# Patient Record
Sex: Male | Born: 1965 | Race: Black or African American | Hispanic: No
Health system: Midwestern US, Community
[De-identification: ages and names within clinical notes are randomized; demographics above are authoritative.]

## PROBLEM LIST (undated history)

## (undated) DIAGNOSIS — N342 Other urethritis: Secondary | ICD-10-CM

## (undated) DIAGNOSIS — M5412 Radiculopathy, cervical region: Secondary | ICD-10-CM

## (undated) DIAGNOSIS — F419 Anxiety disorder, unspecified: Secondary | ICD-10-CM

## (undated) DIAGNOSIS — G459 Transient cerebral ischemic attack, unspecified: Secondary | ICD-10-CM

## (undated) DIAGNOSIS — I639 Cerebral infarction, unspecified: Secondary | ICD-10-CM

## (undated) DIAGNOSIS — I2699 Other pulmonary embolism without acute cor pulmonale: Secondary | ICD-10-CM

## (undated) DIAGNOSIS — Z87442 Personal history of urinary calculi: Secondary | ICD-10-CM

## (undated) HISTORY — PX: NECK SURGERY: SHX720

---

## 2014-04-24 ENCOUNTER — Ambulatory Visit
Admit: 2014-04-24 | Discharge: 2014-04-24 | Payer: PRIVATE HEALTH INSURANCE | Attending: Family Medicine | Primary: Family Medicine

## 2014-04-24 DIAGNOSIS — F319 Bipolar disorder, unspecified: Secondary | ICD-10-CM

## 2014-04-24 NOTE — Progress Notes (Signed)
SUBJECTIVE:  Mitchell Yoder is a 49 y.o. year old male   Chief Complaint   Patient presents with   ??? Establish Care       History of Present Illness:   He is here to establish care.  He comes from out of town.  He has history of mild biploar disorder and is taking medications for it; he does not know the name of the medication.  He has chronic neck/ low back pain and has had treatment for may years from neurosurgery and physiatry in the past.  He was taking pain medications.  He has been out for months and does not know what he was taking.  He needs referral to pain management and psychiatry.     Past Medical History   Diagnosis Date   ??? Gout    ??? Depression    ??? Arthritis    ??? S/P cervical spinal fusion 02/2002     c6-c7   ??? Bulging lumbar disc 1996     L1 -S5   ??? Bipolar affective (HCC) 2013     Past Surgical History   Procedure Laterality Date   ??? Hx cervical fusion          No current outpatient prescriptions on file.     No current facility-administered medications for this visit.       Allergies   Allergen Reactions   ??? Morphine Not Reported This Time        Family History   Problem Relation Age of Onset   ??? Hypertension Mother    ??? Colon Polyps Mother    ??? Neuropathy Mother    ??? Alzheimer Father    ??? Bipolar Disorder Sister    ??? Neuropathy Brother    ??? Cancer Maternal Aunt [id number]     colon cancer   ??? Diabetes Paternal Aunt    ??? Bipolar Disorder Other    ??? Neuropathy Other    ??? Other Son      Mental problems   ??? Other Son      Mental problems   ??? Other Daughter      Mental problems   ??? Diabetes Paternal Aunt         History   Substance Use Topics   ??? Smoking status: Current Some Day Smoker -- 0.25 packs/day for 25 years   ??? Smokeless tobacco: Never Used   ??? Alcohol Use: 3.6 oz/week     6 Cans of beer per week       Review of Systems:   ROS: Constitutional: No fever, chills, night sweats, malaise, dizziness.    Eyes: Denies any acute complaints.     Ear/Nose/Throat: No ear/ throat/ sinus pain, lesions, unusual discharge, new speaking or hearing problems, epistaxis.  Cardiovascular: No angina, palpitations, PND, orthopnea, lightheadedness, edema, claudication.    Respiratory: No dyspnea, wheeze, pleurisy, hemoptysis, unusual cough or sputum.    Gastrointestinal: No nausea/ vomiting, bowel habit change, pain, GER symptoms, melena, hematochezia, anorexia.    Genitourinary: Denies any complaints.    Musculoskeletal: As above.    Skin/Breast: Denies any complaints.    Neurological: No seizures, numbness, dizziness, speech abnormality, incontinence.    Psychiatric: No agitation, confusion/disorientation, suicidal or homicidal ideation.    Endocrine: Denies any complaints.    Heme/Onc/Lympha: Denies any complaints.    Allergy/Immunol: Denies any complaints.    OBJECTIVE:  Physical Exam:   Constitutional: General Appearance:  well developed, well nourished, nontoxic, in no acute distress.  Visit Vitals   Item Reading   ??? BP 108/75 mmHg   ??? Pulse 92   ??? Temp(Src) 98.4 ??F (36.9 ??C) (Oral)   ??? Resp 15   ??? Ht  (1.727 m)   ??? Wt 160 lb (72.576 kg)   ??? BMI 24.33 kg/m2   ??? SpO2 97%     Eyes: Conjunctiva: normal & Lids: normal.  Pupils & Irises: normal size; equal, round and reactive to light.  ENT/Mouth: Otoscopic Examination: ear canals are clear; tympanic membranes are clear.  Nose: clear.  Throat:  clear.  Neck Area: Neck: without masses, symmetric, trachea is midline.  Thyroid:  without significant enlargement, masses or tenderness.  Pulmonary: Respiratory effort: normal; no dyspnea, no retractions, no accessory muscle use.   Auscultation: normal & symmetrical air exchange; no rales, no rhonchi, no wheeze; no rubs  Cardiovascular: Palpation: PMI not displaced or enlarged, no thrills or heaves.   Auscultation: RRR; no murmur, rubs or gallops.   Extremities: no edema, no active varicosity. Neck: carotid arteries- normal volume &  without bruit; no JVD. Femoral arteries: normal volume, no bruit. Pedal pulses: normal volume.  Gastrointestinal: Normal bowel sounds.  No masses; no tenderness; no rebound/rigidity; no CVA tenderness.  No hepatosplenomegaly.  Skin: no acute rash.  Nodes: Cervical: no significant adenopathy.  Inguinal: no significant adenopathy.  Psychiatric: Oriented to time, place and person.   Normal mood, no agitation or anxiety.  Normal affect.  Pleasant and cooperative.  Mild pressure of speech and flight of ideas.   Neurologic: CN I to XII: intact.  DTR: symmetrical & normal.   Musculoskeletal: NC/AT.  Neck-supple. Gait and Station: gait- normal; station- normal.  All Extremities: no heat, effusion, redness or tenderness; normal strength/tone; no instability.  Neck/ low back: no redness, heat or effusion.    Records requested.     ASSESSMENT:     1. Bipolar 1 disorder, depressed (HCC)    2. Chronic pain syndrome    3. Lumbar disc disease    4. S/P cervical spinal fusion        PLAN:       Orders Placed This Encounter   ??? REFERRAL TO PSYCHIATRY   ??? REFERRAL TO PAIN MANAGEMENT        Other Instructions: Discussed DDx, follow-up & work-up.  Discussed risk/benefit & side effect of treatment.  Follow up visit as planned, prn sooner.  Back and neck care.  Health risk from non adherence discussed.  Patient voiced understanding.     Follow-up Disposition:  Return in about 1 month (around 05/23/2014).    Joneen Boers, MD

## 2014-05-21 ENCOUNTER — Encounter

## 2014-05-21 ENCOUNTER — Ambulatory Visit
Admit: 2014-05-21 | Discharge: 2014-05-21 | Payer: PRIVATE HEALTH INSURANCE | Attending: Family Medicine | Primary: Family Medicine

## 2014-05-21 DIAGNOSIS — N342 Other urethritis: Secondary | ICD-10-CM

## 2014-05-21 LAB — AMB POC URINALYSIS DIP STICK AUTO W/O MICRO
Bilirubin (UA POC): NEGATIVE
Blood (UA POC): NEGATIVE
Glucose (UA POC): NEGATIVE
Ketones (UA POC): NEGATIVE
Leukocyte esterase (UA POC): NEGATIVE
Nitrites (UA POC): NEGATIVE
Protein (UA POC): NEGATIVE mg/dL
Specific gravity (UA POC): 1.03 (ref 1.001–1.035)
Urobilinogen (UA POC): 0.2 (ref 0.2–1)
pH (UA POC): 7.5 (ref 4.6–8.0)

## 2014-05-21 MED ORDER — AZITHROMYCIN 250 MG TAB
250 mg | ORAL_TABLET | ORAL | Status: AC
Start: 2014-05-21 — End: 2014-05-26

## 2014-05-21 MED ORDER — CEFTRIAXONE 250 MG SOLUTION FOR INJECTION
250 mg | Freq: Once | INTRAMUSCULAR | Status: AC
Start: 2014-05-21 — End: 2014-05-21

## 2014-05-21 NOTE — Patient Instructions (Signed)
Ceftriaxone (Injection)   Ceftriaxone (sef-trye-AX-one)  Treats infections. This medicine is a cephalosporin antibiotic.   Brand Name(s):Amerinet Choice cefTRIAXone, Novaplus Ceftriaxone, Novaplus cefTRIAXone, Rocephin   There may be other brand names for this medicine.  When This Medicine Should Not Be Used:   This medicine is not right for everyone. Do not use it if you had an allergic reaction to any other cephalosporin antibiotic.  How to Use This Medicine:   Injectable  ?? Your doctor will prescribe your exact dose and tell you how often it should be given. This medicine is given as a shot into a muscle or through a needle placed into a vein.  ?? A nurse or other health provider will give you this medicine.  ?? Missed dose: You must use this medicine on a fixed schedule. Call your doctor or pharmacist if you miss a dose.  Drugs and Foods to Avoid:     Ask your doctor or pharmacist before using any other medicine, including over-the-counter medicines, vitamins, and herbal products.     Warnings While Using This Medicine:   ?? Tell your doctor if you are pregnant or breastfeeding, or if you have kidney disease, liver disease, anemia, gallbladder disease, pancreas problems, or a history of stomach or bowel disease, such as colitis. Tell your doctor if you are allergic to penicillin.  ?? This medicine can cause diarrhea. Call your doctor if the diarrhea becomes severe, does not stop, or is bloody. Do not take any medicine to stop diarrhea until you have talked to your doctor. Diarrhea can occur 2 months or more after you stop taking this medicine.  ?? Your doctor will do lab tests at regular visits to check on the effects of this medicine. Keep all appointments.  ?? Take all of the medicine in your prescription to clear up your infection, even if you feel better after the first few doses.  ?? Call your doctor if your symptoms do not improve or if they get worse.  Possible Side Effects While Using This Medicine:    Call your doctor right away if you notice any of these side effects:  ?? Allergic reaction: Itching or hives, swelling in your face or hands, swelling or tingling in your mouth or throat, chest tightness, trouble breathing  ?? Dark urine or pale stools, nausea, vomiting, loss of appetite, stomach pain, yellow skin or eyes  ?? Severe diarrhea, diarrhea that contains blood, or vomiting  ?? Shortness of breath, tiredness, uneven heartbeat  ?? Unusual bleeding, bruising, or weakness  If you notice these less serious side effects, talk with your doctor:   ?? Change or loss of taste  ?? Dizziness or headache  ?? Mild rash or itching skin  ?? Pain, redness, or swelling where the shot is given  ?? Vaginal itching or discharge  If you notice other side effects that you think are caused by this medicine, tell your doctor.   Call your doctor for medical advice about side effects. You may report side effects to FDA at 1-800-FDA-1088  ?? 2014 [employer] Information is for End User's use only and may not be sold, redistributed or otherwise used for commercial purposes.  The above information is an educational aid only. It is not intended as medical advice for individual conditions or treatments. Talk to your doctor, nurse or pharmacist before following any medical regimen to see if it is safe and effective for you.

## 2014-05-21 NOTE — Progress Notes (Signed)
Michael LitterJonathan Holzmann is a 49 y.o. male presents today for same day sick visit for male issues. Pt is in Room # 6      Learning Assessment (baseline): Completed  Depression Screening: Completed      1. Have you been to the ER, urgent care clinic since your last visit? Hospitalized since your last visit?No    2. Have you seen or consulted any other health care providers outside of the [facility] since your last visit? Include any pap smears or colon screening. No      Michael LitterJonathan Slingerland is a 49 y.o. male who presents for Rocephin injection.    He declines to have Rocephin injection in gluteal area, however requests in right deltoid.  He denies any symptoms , reactions or allergies that would exclude them from receiving this injection today.  Risks and adverse reactions were discussed and the  patient education sheet was given to them. All questions were addressed.  He was observed for 5 min post injection, declines any further observation. There were no reactions observed.    Zachary GeorgeMichelle M Graycen Degan

## 2014-05-21 NOTE — Progress Notes (Signed)
SUBJECTIVE:  Mitchell Yoder is a 49 y.o. year old male   Chief Complaint   Patient presents with   ??? Penile Discharge       History of Present Illness:   He is having mild purulent penile discharge off and on with some dysuria for 10 days.  He has a new partner with intercourse a week earlier. No systemic symptoms.  No other new complaints.    Past Medical History   Diagnosis Date   ??? Gout    ??? Depression    ??? Arthritis    ??? S/P cervical spinal fusion 02/2002     c6-c7   ??? Bulging lumbar disc 1996     L1 -S5   ??? Bipolar affective (HCC) 2013   ??? Hx of pulmonary embolus      Past Surgical History   Procedure Laterality Date   ??? Hx cervical fusion          Current Outpatient Prescriptions   Medication Sig   ??? indomethacin (INDOCIN) 50 mg capsule Take 50 mg by mouth three (3) times daily. Indications: GOUT   ??? busPIRone (BUSPAR) 10 mg tablet Take 10 mg by mouth three (3) times daily.     No current facility-administered medications for this visit.       Allergies   Allergen Reactions   ??? Morphine Not Reported This Time        Family History   Problem Relation Age of Onset   ??? Hypertension Mother    ??? Colon Polyps Mother    ??? Neuropathy Mother    ??? Alzheimer Father    ??? Bipolar Disorder Sister    ??? Neuropathy Brother    ??? Cancer Maternal Aunt 85     colon cancer   ??? Diabetes Paternal Aunt    ??? Bipolar Disorder Other    ??? Neuropathy Other    ??? Other Son      Mental problems   ??? Other Son      Mental problems   ??? Other Daughter      Mental problems   ??? Diabetes Paternal Aunt         History   Substance Use Topics   ??? Smoking status: Current Some Day Smoker -- 0.25 packs/day for 25 years   ??? Smokeless tobacco: Never Used   ??? Alcohol Use: 3.6 oz/week     6 Cans of beer per week       Review of Systems:   ROS: Constitutional: No fever, chills, night sweats, malaise, dizziness.    Respiratory: No dyspnea, wheeze, pleurisy, hemoptysis, unusual cough or sputum.     Gastrointestinal: No nausea/ vomiting, bowel habit change, pain, GER symptoms, melena, hematochezia, anorexia.    Genitourinary: No hematuria, hesitancy, incontinence.  Skin/Breast: Denies any complaints.    Psychiatric: No agitation, confusion/disorientation, suicidal or homicidal ideation.      OBJECTIVE:  Physical Exam:   Constitutional: General Appearance:  well developed, well nourished, nontoxic, in no acute distress.   Visit Vitals   Item Reading   ??? BP 108/76 mmHg   ??? Pulse 76   ??? Temp(Src) 98.3 ??F (36.8 ??C) (Oral)   ??? Resp 15   ??? Ht  (1.727 m)   ??? Wt 158 lb (71.668 kg)   ??? BMI 24.03 kg/m2   ??? SpO2 97%     Pulmonary: Respiratory effort: normal; no dyspnea, no retractions, no accessory muscle use.     Skin: no  acute rash.  Nodes:  Inguinal: no significant adenopathy.  Psychiatric: Oriented to time, place and person.   Musculoskeletal: NC/AT.  Neck-supple.  GI:: Normal bowel sounds.  No masses; no tenderness; no rebound/rigidity; no CVA tenderness.  No hepatosplenomegaly.  GU:: Scrotal: no cord tenderness, testicular masses, hydrocele or spermatocele.   Penis: normal male, no masses, normal urethra, no discharge.  U/A- Neg.       ASSESSMENT:     1. Urethritis, sexually transmitted        PLAN:   Pharmacologic Management: Medications reviewed with the patient.  Zithromax 1 gm x1, Rocephin 250 mg x1.    Orders Placed This Encounter   ??? CHLAMYDIA/NEISSERIA BY NAA W/REFLEX CONFIRM   ??? AMB POC URINALYSIS DIP STICK AUTO W/O MICRO   ??? PR THER/PROPH/DIAG INJECTION, SUBCUT/IM   ??? cefTRIAXone (ROCEPHIN) injection   ??? azithromycin (ZITHROMAX) 250 mg tablet     Other Instructions: Discussed DDx, follow-up & work-up.  Discussed risk/benefit & side effect of treatment.  Follow up visit as planned (One week), prn sooner.  STD precautions.  Treat partner(s).  All discussed in details. Health risk from non adherence discussed.  Patient voiced understanding.     Joneen BoersSAGHANA Adellyn Capek, MD

## 2014-05-25 LAB — CHLAM/GC/TRICH NUCL AMP URINE
Chlamydia amplified,urine: NEGATIVE
GC Amplified urine: NEGATIVE
TRICH NUCU: NEGATIVE

## 2014-05-28 ENCOUNTER — Ambulatory Visit
Admit: 2014-05-28 | Discharge: 2014-05-28 | Payer: PRIVATE HEALTH INSURANCE | Attending: Family Medicine | Primary: Family Medicine

## 2014-05-28 ENCOUNTER — Encounter

## 2014-05-28 DIAGNOSIS — Z981 Arthrodesis status: Secondary | ICD-10-CM

## 2014-05-28 NOTE — Progress Notes (Signed)
Mitchell Yoder is a 49 y.o. male presents today for follow-up. Pt is in Room # 4    1. Have you been to the ER, urgent care clinic since your last visit?  Hospitalized since your last visit?No    2. Have you seen or consulted any other health care providers outside of the [facility] since your last visit?  Include any pap smears or colon screening. No
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
## 2014-06-01 LAB — CBC WITH AUTOMATED DIFF
ABS. BASOPHILS: 0 10*3/uL (ref 0.0–0.2)
ABS. EOSINOPHILS: 0.2 10*3/uL (ref 0.0–0.5)
ABS. MONOCYTES: 0.5 10*3/uL (ref 0.1–0.9)
ABS. NEUTROPHILS: 2.1 10*3/uL (ref 1.8–7.7)
ABSOLUTE LYMPHOCYTE COUNT: 2.3 10*3/uL (ref 1.0–4.8)
BASOPHILS: 0 % (ref 0–2)
EOSINOPHILS: 3 % (ref 0–6)
HCT: 48.5 % (ref 39.3–51.6)
HGB: 15.4 g/dL (ref 13.1–17.2)
Lymphocytes: 45 % (ref 27–45)
MCH: 33 pg (ref 26–34)
MCHC: 32 g/dL (ref 32–36)
MCV: 104 fL — ABNORMAL HIGH (ref 80–95)
MONOCYTES: 11 % — ABNORMAL HIGH (ref 3–9)
MPV: 10.1 fL (ref 6.0–10.8)
NEUTROPHILS: 41 % (ref 40–75)
PLATELET: 346 10*3/uL (ref 140–440)
RBC: 4.67 M/uL (ref 3.80–5.80)
RDW: 12.6 % (ref 10.0–16.0)
WBC: 5.1 10*3/uL (ref 4.0–11.0)

## 2014-06-01 LAB — METABOLIC PANEL, COMPREHENSIVE
A-G Ratio: 1.9 ratio (ref 1.1–2.6)
ALT (SGPT): 19 U/L (ref 5–40)
AST (SGOT): 21 U/L (ref 10–37)
Albumin: 4.4 g/dL (ref 3.5–5.0)
Alk. phosphatase: 71 U/L (ref 25–115)
Anion gap: 14 mmol/L
BUN: 9 mg/dL (ref 6–22)
Bilirubin, total: 0.4 mg/dL (ref 0.2–1.2)
CO2: 26 mmol/L (ref 20–32)
Calcium: 9.6 mg/dL (ref 8.4–10.4)
Chloride: 103 mmol/L (ref 98–110)
Creatinine: 0.9 mg/dL (ref 0.5–1.2)
GFRAA: >60 (ref >60.0)
GFRNA: >60 (ref >60.0)
Globulin: 2.3 g/dL (ref 2.0–4.0)
Glucose: 77 mg/dL (ref 65–99)
Potassium: 4.3 mmol/L (ref 3.5–5.5)
Protein, total: 6.7 g/dL (ref 6.4–8.3)
Sodium: 143 mmol/L (ref 133–145)

## 2014-06-01 LAB — VITAMIN B12: Vitamin B12: 366 pg/mL (ref 211–911)

## 2014-06-01 LAB — TSH 3RD GENERATION: TSH: 1.87 uU/mL (ref 0.27–4.20)

## 2014-06-01 LAB — VITAMIN D, 1, 25 DIHYDROXY: Calcitriol (Vit D 1, 25 di-OH): 52.2 pg/mL (ref 19.9–79)

## 2014-06-02 NOTE — Telephone Encounter (Signed)
Attempted to contact patient Mitchell Yoder regarding lab finding.  Call back number left and will call again.

## 2014-06-02 NOTE — Progress Notes (Signed)
Quick Note:        MCV was high. This is nonspecific. Otherwise BW was in range. Vitamin B12 was low normal. Advise taking OTC Vitamin B12 500 mcg daily.    ______

## 2014-07-09 ENCOUNTER — Encounter: Attending: Family Medicine | Primary: Family Medicine

## 2014-10-01 NOTE — Addendum Note (Signed)
Addended by: Marlane HatcherPierce, Phyllis M on: 10/01/2014 01:51 PM      Modules accepted: Level of Service

## 2017-11-29 ENCOUNTER — Emergency Department (HOSPITAL_COMMUNITY): Payer: Medicaid - Out of State

## 2017-11-29 ENCOUNTER — Other Ambulatory Visit: Payer: Self-pay

## 2017-11-29 ENCOUNTER — Inpatient Hospital Stay (HOSPITAL_COMMUNITY)
Admission: EM | Admit: 2017-11-29 | Discharge: 2017-12-03 | DRG: 176 | Disposition: A | Discharge status: Home / Self Care | Payer: Medicaid - Out of State | Attending: Internal Medicine | Admitting: Internal Medicine

## 2017-11-29 ENCOUNTER — Encounter (HOSPITAL_COMMUNITY): Payer: Self-pay | Admitting: Emergency Medicine

## 2017-11-29 DIAGNOSIS — Z8673 Personal history of transient ischemic attack (TIA), and cerebral infarction without residual deficits: Secondary | ICD-10-CM

## 2017-11-29 DIAGNOSIS — Z791 Long term (current) use of non-steroidal anti-inflammatories (NSAID): Secondary | ICD-10-CM

## 2017-11-29 DIAGNOSIS — F17210 Nicotine dependence, cigarettes, uncomplicated: Secondary | ICD-10-CM | POA: Diagnosis present

## 2017-11-29 DIAGNOSIS — I2699 Other pulmonary embolism without acute cor pulmonale: Principal | ICD-10-CM | POA: Diagnosis present

## 2017-11-29 DIAGNOSIS — Z23 Encounter for immunization: Secondary | ICD-10-CM

## 2017-11-29 DIAGNOSIS — R0902 Hypoxemia: Secondary | ICD-10-CM | POA: Diagnosis present

## 2017-11-29 DIAGNOSIS — F1410 Cocaine abuse, uncomplicated: Secondary | ICD-10-CM | POA: Diagnosis present

## 2017-11-29 DIAGNOSIS — Z86711 Personal history of pulmonary embolism: Secondary | ICD-10-CM

## 2017-11-29 DIAGNOSIS — Z79899 Other long term (current) drug therapy: Secondary | ICD-10-CM

## 2017-11-29 DIAGNOSIS — Z7982 Long term (current) use of aspirin: Secondary | ICD-10-CM

## 2017-11-29 HISTORY — DX: Other pulmonary embolism without acute cor pulmonale: I26.99

## 2017-11-29 HISTORY — DX: Personal history of urinary calculi: Z87.442

## 2017-11-29 HISTORY — DX: Cerebral infarction, unspecified: I63.9

## 2017-11-29 HISTORY — DX: Transient cerebral ischemic attack, unspecified: G45.9

## 2017-11-29 HISTORY — DX: Anxiety disorder, unspecified: F41.9

## 2017-11-29 LAB — CBC
HEMATOCRIT: 47.9 % (ref 39.0–52.0)
HEMOGLOBIN: 15.9 g/dL (ref 13.0–17.0)
MCH: 33.2 pg (ref 26.0–34.0)
MCHC: 33.2 g/dL (ref 30.0–36.0)
MCV: 100 fL (ref 78.0–100.0)
Platelets: 332 10*3/uL (ref 150–400)
RBC: 4.79 MIL/uL (ref 4.22–5.81)
RDW: 11.4 % — ABNORMAL LOW (ref 11.5–15.5)
WBC: 6.7 10*3/uL (ref 4.0–10.5)

## 2017-11-29 LAB — BASIC METABOLIC PANEL
ANION GAP: 11 (ref 5–15)
BUN: 15 mg/dL (ref 6–20)
CALCIUM: 9.2 mg/dL (ref 8.9–10.3)
CO2: 25 mmol/L (ref 22–32)
Chloride: 105 mmol/L (ref 98–111)
Creatinine, Ser: 1.18 mg/dL (ref 0.61–1.24)
GLUCOSE: 132 mg/dL — AB (ref 70–99)
Potassium: 3.8 mmol/L (ref 3.5–5.1)
SODIUM: 141 mmol/L (ref 135–145)

## 2017-11-29 LAB — I-STAT TROPONIN, ED: Troponin i, poc: 0 ng/mL (ref 0.00–0.08)

## 2017-11-29 MED ORDER — LORAZEPAM 1 MG PO TABS: 1.0000 mg | ORAL_TABLET | Freq: Four times a day (QID) | ORAL | Status: DC | PRN

## 2017-11-29 MED ORDER — ACETAMINOPHEN 650 MG RE SUPP: 650.0000 mg | Freq: Four times a day (QID) | RECTAL | Status: DC | PRN

## 2017-11-29 MED ORDER — HYDROMORPHONE HCL 1 MG/ML IJ SOLN
0.5000 mg | Freq: Once | INTRAMUSCULAR | Status: AC
  Administered 2017-11-29: 0.5 mg via INTRAVENOUS
  Filled 2017-11-29: qty 1

## 2017-11-29 MED ORDER — VITAMIN B-1 100 MG PO TABS
100.0000 mg | ORAL_TABLET | Freq: Every day | ORAL | Status: DC
  Administered 2017-11-29 – 2017-12-03 (×5): 100 mg via ORAL
  Filled 2017-11-29 (×6): qty 1

## 2017-11-29 MED ORDER — LORAZEPAM 2 MG/ML IJ SOLN: 1.0000 mg | Freq: Four times a day (QID) | INTRAMUSCULAR | Status: DC | PRN

## 2017-11-29 MED ORDER — PNEUMOCOCCAL VAC POLYVALENT 25 MCG/0.5ML IJ INJ
0.5000 mL | INJECTION | INTRAMUSCULAR | Status: AC
  Administered 2017-12-03: 0.5 mL via INTRAMUSCULAR
  Filled 2017-11-29 (×2): qty 0.5

## 2017-11-29 MED ORDER — ONDANSETRON HCL 4 MG PO TABS: 4.0000 mg | ORAL_TABLET | Freq: Four times a day (QID) | ORAL | Status: DC | PRN

## 2017-11-29 MED ORDER — ONDANSETRON HCL 4 MG/2ML IJ SOLN: 4.0000 mg | Freq: Four times a day (QID) | INTRAMUSCULAR | Status: DC | PRN

## 2017-11-29 MED ORDER — HEPARIN (PORCINE) IN NACL 100-0.45 UNIT/ML-% IJ SOLN
1350.0000 [IU]/h | INTRAMUSCULAR | Status: DC
  Administered 2017-11-29: 1200 [IU]/h via INTRAVENOUS
  Administered 2017-12-01: 1350 [IU]/h via INTRAVENOUS
  Filled 2017-11-29 (×3): qty 250

## 2017-11-29 MED ORDER — ACETAMINOPHEN 325 MG PO TABS
650.0000 mg | ORAL_TABLET | Freq: Four times a day (QID) | ORAL | Status: DC | PRN
  Administered 2017-12-01: 650 mg via ORAL
  Filled 2017-11-29: qty 2

## 2017-11-29 MED ORDER — IOPAMIDOL (ISOVUE-370) INJECTION 76%
100.0000 mL | Freq: Once | INTRAVENOUS | Status: AC | PRN
  Administered 2017-11-29: 100 mL via INTRAVENOUS

## 2017-11-29 MED ORDER — NICOTINE 7 MG/24HR TD PT24
7.0000 mg | MEDICATED_PATCH | Freq: Every day | TRANSDERMAL | Status: DC
  Administered 2017-11-29 – 2017-12-02 (×4): 7 mg via TRANSDERMAL
  Filled 2017-11-29 (×4): qty 1

## 2017-11-29 MED ORDER — IOPAMIDOL (ISOVUE-370) INJECTION 76%
INTRAVENOUS | Status: AC
  Filled 2017-11-29: qty 100

## 2017-11-29 MED ORDER — FOLIC ACID 1 MG PO TABS
1.0000 mg | ORAL_TABLET | Freq: Every day | ORAL | Status: DC
  Administered 2017-11-29 – 2017-12-03 (×5): 1 mg via ORAL
  Filled 2017-11-29 (×5): qty 1

## 2017-11-29 MED ORDER — ADULT MULTIVITAMIN W/MINERALS CH
1.0000 | ORAL_TABLET | Freq: Every day | ORAL | Status: DC
  Administered 2017-11-29 – 2017-12-03 (×5): 1 via ORAL
  Filled 2017-11-29 (×5): qty 1

## 2017-11-29 MED ORDER — TRAMADOL HCL 50 MG PO TABS
50.0000 mg | ORAL_TABLET | Freq: Four times a day (QID) | ORAL | Status: DC
  Administered 2017-11-29 – 2017-12-03 (×13): 50 mg via ORAL
  Filled 2017-11-29 (×14): qty 1

## 2017-11-29 MED ORDER — THIAMINE HCL 100 MG/ML IJ SOLN
100.0000 mg | Freq: Every day | INTRAMUSCULAR | Status: DC
  Administered 2017-11-30: 100 mg via INTRAVENOUS
  Filled 2017-11-29: qty 2

## 2017-11-29 MED ORDER — INFLUENZA VAC SPLIT QUAD 0.5 ML IM SUSY
0.5000 mL | PREFILLED_SYRINGE | INTRAMUSCULAR | Status: AC
  Administered 2017-12-01: 0.5 mL via INTRAMUSCULAR
  Filled 2017-11-29: qty 0.5

## 2017-11-29 MED ORDER — HEPARIN BOLUS VIA INFUSION
4200.0000 [IU] | Freq: Once | INTRAVENOUS | Status: AC
  Administered 2017-11-29: 4200 [IU] via INTRAVENOUS
  Filled 2017-11-29: qty 4200

## 2017-11-29 MED ORDER — POTASSIUM CHLORIDE IN NACL 20-0.45 MEQ/L-% IV SOLN
INTRAVENOUS | Status: DC
  Administered 2017-11-29 – 2017-11-30 (×2): via INTRAVENOUS
  Filled 2017-11-29 (×2): qty 1000

## 2017-11-29 NOTE — ED Provider Notes (Signed)
Patient placed in Quick Look pathway, seen and evaluated   Chief Complaint: chest pain  HPI:   Mike Castillo is a 52 y.o. male with hx of PE x3 who presents to the ED with chest pain and shortness of breath. Pt reports chest pain and SHOB starting today. 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 Pain increases with deep breath and movement.  ROS: Resp: shortness of breath  CV: chest pain  Physical Exam:  BP 126/81   Pulse (!) 126   Temp 99.1 F (37.3 C) (Oral)   Resp 20   SpO2 97%    Gen: No distress  Neuro: Awake and Alert  Skin: Warm and dry  Chest: tender with palpation left upper chest wall  Heart: tachycardia  Will try and place patient in room ASAP   Initiation of care has begun. The patient has been counseled on the process, plan, and necessity for staying for the completion/evaluation, and the remainder of the medical screening examination    Ashley Murrain, NP 11/29/17 [place]    Quintella Reichert, MD 11/30/17 [phone]

## 2017-11-29 NOTE — ED Notes (Signed)
Pt in XR, XR will bring pt to room.

## 2017-11-29 NOTE — ED Notes (Addendum)
Dr. Ralene Bathe stated EKG was abnormal, no STEMI.  Requested repeat EKG in 5-10 minutes.

## 2017-11-29 NOTE — ED Provider Notes (Signed)
[facility] EMERGENCY DEPARTMENT Provider Note   CSN: [id number] Arrival date & time: 11/29/17  1552     History   Chief Complaint Chief Complaint  Patient presents with  . Chest Pain    HPI Mike Castillo is a 52 y.o. male with past medical history of prior PE x3, TIA, presents to ED for evaluation of acute onset 6-hour history of left-sided chest pain and shortness of breath.  States that symptoms feel similar to his prior PE.  Last PE was diagnosed in 2011.  He is not currently anticoagulated.  States that pain is worse with deep inspiration and palpation.  Reports intermittent cough but denies any hemoptysis.  Reports nausea but no vomiting.  Pain will radiate to his left shoulder and left side of his back.  Denies any injuries or falls.  He does admit to cocaine and marijuana use with last cocaine use being 4 to 5 days ago.  Denies any abdominal pain, leg swelling, recent surgeries, recent prolonged travel, history of MI.  HPI  Past Medical History:  Diagnosis Date  . Pulmonary embolus ([place])   . TIA (transient ischemic attack)     Patient Active Problem List   Diagnosis Date Noted  . Pulmonary embolism ([place]) 11/29/2017    Past Surgical History:  Procedure Laterality Date  . NECK SURGERY          Home Medications    Prior to Admission medications   Medication Sig Start Date End Date Taking? Authorizing Provider  diclofenac sodium (VOLTAREN) 1 % GEL Apply 2 g topically 3 (three) times daily. 12/28/16  Yes [provider]  Multiple Vitamin (THERA) TABS Take 1 tablet by mouth daily. 08/08/16  Yes [provider]  albuterol (PROVENTIL HFA;VENTOLIN HFA) 108 (90 Base) MCG/ACT inhaler Inhale 1 puff into the lungs every 4 (four) hours as needed for shortness of breath.    [provider]  aspirin EC 81 MG tablet Take 81 mg by mouth daily.    [provider]  atorvastatin (LIPITOR) 40 MG tablet Take 40 mg by mouth at  bedtime. 08/08/16   [provider]  gabapentin (NEURONTIN) 600 MG tablet Take 600 mg by mouth 3 (three) times daily. 08/08/16   [provider]  lamoTRIgine (LAMICTAL) 100 MG tablet Take 100 mg by mouth daily.    [provider]  meloxicam (MOBIC) 15 MG tablet Take 15 mg by mouth daily. 10/08/17   [provider]  tiZANidine (ZANAFLEX) 4 MG tablet Take 4 mg by mouth every 6 (six) hours as needed for pain. 08/02/16   [provider]    Family History No family history on file.  Social History Social History   Tobacco Use  . Smoking status: Current Every Day Smoker    Packs/day: 0.25  . Smokeless tobacco: Never Used  Substance Use Topics  . Alcohol use: Yes  . Drug use: Yes    Types: Marijuana, Cocaine     Allergies   Morphine and related   Review of Systems Review of Systems  Constitutional: Negative for appetite change, chills and fever.  HENT: Negative for ear pain, rhinorrhea, sneezing and sore throat.   Eyes: Negative for photophobia and visual disturbance.  Respiratory: Positive for shortness of breath. Negative for cough, chest tightness and wheezing.   Cardiovascular: Positive for chest pain. Negative for palpitations.  Gastrointestinal: Negative for abdominal pain, blood in stool, constipation, diarrhea, nausea and vomiting.  Genitourinary: Negative for dysuria, hematuria  and urgency.  Musculoskeletal: Positive for back pain. Negative for myalgias.  Skin: Negative for rash.  Neurological: Negative for dizziness, weakness and light-headedness.     Physical Exam Updated Vital Signs BP (!) 130/91 (BP Location: Left Arm)   Pulse (!) 102   Temp 98.5 F (36.9 C) (Oral)   Resp 19   Ht 5\' 8"  (1.727 m)   Wt 69.9 kg   SpO2 97%   BMI 23.42 kg/m   Physical Exam  Constitutional: He appears well-developed and well-nourished. No distress.  Appears uncomfortable.  HENT:  Head: Normocephalic and atraumatic.  Nose: Nose  normal.  Eyes: Conjunctivae and EOM are normal. Left eye exhibits no discharge. No scleral icterus.  Neck: Normal range of motion. Neck supple.  Cardiovascular: Regular rhythm, normal heart sounds and intact distal pulses. Tachycardia present. Exam reveals no gallop and no friction rub.  No murmur heard. Pulmonary/Chest: Effort normal and breath sounds normal. No respiratory distress.  Left-sided chest tenderness to palpation.  Abdominal: Soft. Bowel sounds are normal. He exhibits no distension. There is no tenderness. There is no guarding.  Musculoskeletal: Normal range of motion. He exhibits no edema.  No lower extremity edema, erythema or calf tenderness bilaterally.  Neurological: He is alert. He exhibits normal muscle tone. Coordination normal.  Skin: Skin is warm and dry. No rash noted.  Psychiatric: He has a normal mood and affect.  Nursing note and vitals reviewed.    ED Treatments / Results  Labs (all labs ordered are listed, but only abnormal results are displayed) Labs Reviewed  BASIC METABOLIC PANEL - Abnormal; Notable for the following components:      Result Value   Glucose, Bld 132 (*)    All other components within normal limits  CBC - Abnormal; Notable for the following components:   RDW 11.4 (*)    All other components within normal limits  HIV ANTIBODY (ROUTINE TESTING W REFLEX)  CBC  BASIC METABOLIC PANEL  HEPARIN LEVEL (UNFRACTIONATED)  I-STAT TROPONIN, ED    EKG None  Radiology Dg Chest 2 View  Result Date: 11/29/2017 CLINICAL DATA:  Chest pain and shortness of breath today, history of multiple pulmonary emboli EXAM: CHEST - 2 VIEW COMPARISON:  None. FINDINGS: No active infiltrate or effusion is seen. Mediastinal and hilar contours are unremarkable. The heart is within normal limits in size. No bony abnormality is seen. IMPRESSION: No active cardiopulmonary disease. Electronically Signed   By: Ivar Drape M.D.   On: 11/29/2017 17:11   Ct Angio Chest Pe  W And/or Wo Contrast  Result Date: 11/29/2017 CLINICAL DATA:  Chest pain and shortness of breath. Reported history of multiple PEs. EXAM: CT ANGIOGRAPHY CHEST WITH CONTRAST TECHNIQUE: Multidetector CT imaging of the chest was performed using the standard protocol during bolus administration of intravenous contrast. Multiplanar CT image reconstructions and MIPs were obtained to evaluate the vascular anatomy. CONTRAST:  123mL ISOVUE-370 IOPAMIDOL (ISOVUE-370) INJECTION 76% COMPARISON:  None. FINDINGS: Cardiovascular: Segmental and subsegmental pulmonary embolus is identified in all lobes of both lungs. There is some lobar pulmonary artery involvement in the right middle lobe. No CT evidence for right heart strain. No thoracic aortic aneurysm. Mediastinum/Nodes: No mediastinal lymphadenopathy. There is no hilar lymphadenopathy. The esophagus has normal imaging features. There is no axillary lymphadenopathy. Lungs/Pleura: The central tracheobronchial airways are patent. Peripheral subpleural areas of ground-glass attenuation are identified in the right upper lobe and left upper lobe. Compressive atelectasis noted in the dependent lower lungs bilaterally. Subpleural in  centrilobular emphysema noted no pulmonary edema or pleural effusion. Upper Abdomen: 4 mm nonobstructing stone identified upper pole left kidney. Other bilateral renal calculi are evident. Musculoskeletal: No worrisome lytic or sclerotic osseous abnormality. Review of the MIP images confirms the above findings. IMPRESSION: 1. Diffuse bilateral segmental and subsegmental pulmonary embolus identified in all lobes of both lungs. No CT evidence for right heart strain. 2. Several peripheral areas of subpleural ground-glass attenuation in the right upper lobe may reflect areas of pulmonary infarction. Consider follow-up CT chest without contrast in 3 months to re-evaluate. 3. Bilateral nephrolithiasis. 4. Critical Value/emergent results were called by  telephone at the time of interpretation on 11/29/2017 at 6:37 pm to Mr. Athena Masse, [place], who verbally acknowledged these results. Electronically Signed   By: Misty Stanley M.D.   On: 11/29/2017 18:37    Procedures Procedures (including critical care time)  CRITICAL CARE Performed by: Delia Heady   Total critical care time: 45 minutes  Critical care time was exclusive of separately billable procedures and treating other patients.  Critical care was necessary to treat or prevent imminent or life-threatening deterioration.  Critical care was time spent personally by me on the following activities: development of treatment plan with patient and/or surrogate as well as nursing, discussions with consultants, evaluation of patient's response to treatment, examination of patient, obtaining history from patient or surrogate, ordering and performing treatments and interventions, ordering and review of laboratory studies, ordering and review of radiographic studies, pulse oximetry and re-evaluation of patient's condition.   Medications Ordered in ED Medications  iopamidol (ISOVUE-370) 76 % injection (has no administration in time range)  heparin ADULT infusion 100 units/mL (25000 units/23mL sodium chloride 0.45%) (1,200 Units/hr Intravenous Transfusing/Transfer 11/29/17 2003)  0.45 % NaCl with KCl 20 mEq / L infusion ( Intravenous New Bag/Given 11/29/17 2152)  acetaminophen (TYLENOL) tablet 650 mg (has no administration in time range)    Or  acetaminophen (TYLENOL) suppository 650 mg (has no administration in time range)  ondansetron (ZOFRAN) tablet 4 mg (has no administration in time range)    Or  ondansetron (ZOFRAN) injection 4 mg (has no administration in time range)  traMADol (ULTRAM) tablet 50 mg (50 mg Oral Given 11/29/17 2150)  nicotine (NICODERM CQ - dosed in mg/24 hr) patch 7 mg (7 mg Transdermal Patch Applied 11/29/17 2144)  LORazepam (ATIVAN) tablet 1 mg (has no administration in time range)     Or  LORazepam (ATIVAN) injection 1 mg (has no administration in time range)  thiamine (VITAMIN B-1) tablet 100 mg (100 mg Oral Given 11/29/17 2145)    Or  thiamine (B-1) injection 100 mg ( Intravenous See Alternative 9/38/18 2993)  folic acid (FOLVITE) tablet 1 mg (1 mg Oral Given 11/29/17 2145)  multivitamin with minerals tablet 1 tablet (1 tablet Oral Given 11/29/17 2145)  Influenza vac split quadrivalent PF (FLUARIX) injection 0.5 mL (has no administration in time range)  pneumococcal 23 valent vaccine (PNU-IMMUNE) injection 0.5 mL (has no administration in time range)  iopamidol (ISOVUE-370) 76 % injection 100 mL (100 mLs Intravenous Contrast Given 11/29/17 1806)  heparin bolus via infusion 4,200 Units (4,200 Units Intravenous Bolus from Bag 11/29/17 1938)     Initial Impression / Assessment and Plan / ED Course  I have reviewed the triage vital signs and the nursing notes.  Pertinent labs & imaging results that were available during my care of the patient were reviewed by me and considered in my medical decision making (see chart for details).  Clinical Course as of Nov 29 2208  Thu Nov 29, 2017  1843 Call received from Radiologist of +PE study. Current care team informed of results.   [BM]    Clinical Course User Index [BM] Gari Crown    52 year old male with past medical history of prior PE x3, TIA, not currently anticoagulated with most recent PE diagnosed in 2011 presents to ED for acute onset of chest pain, shortness of breath and tachycardia.  States that it feels similar to his prior PEs.  Patient found to be tachycardic to 120s on arrival here.  Oxygen saturations 98 to 100% on room air.  No lower extremity edema that was suggest DVT.  He is afebrile.  Chest pain is reproducible with palpation.  Lungs are clear to auscultation bilaterally.  Troponin unremarkable.  Chest x-ray is negative.  CBC, BMP unremarkable.  CTA of the chest shows diffuse bilateral PEs with  signs concerning for infarction.  Patient remains hemodynamically stable and heart rate has improved to 10 3-1 07.  Patient will need to be admitted for pulmonary embolism.  Will order heparin.  Hospitalist to admit. Patient discussed with and seen by my attending, Dr. Rex Kras.  Portions of this note were generated with Lobbyist. Dictation errors may occur despite best attempts at proofreading.   Final Clinical Impressions(s) / ED Diagnoses   Final diagnoses:  Acute pulmonary embolism without acute cor pulmonale, unspecified pulmonary embolism type [facility])    ED Discharge Orders    None       Delia Heady, PA-C 11/29/17 2213    Little, Wenda Overland, MD 11/30/17 1051

## 2017-11-29 NOTE — ED Notes (Signed)
Admitting provider remains at bedside

## 2017-11-29 NOTE — Progress Notes (Signed)
ANTICOAGULATION CONSULT NOTE - Initial Consult  Pharmacy Consult for heparin Indication: pulmonary embolus  Allergies  Allergen Reactions  . Morphine And Related Itching    Patient Measurements:   Heparin Dosing Weight: 70  Vital Signs: Temp: 99.1 F (37.3 C) (09/19 1614) Temp Source: Oral (09/19 1614) BP: 121/86 (09/19 1800) Pulse Rate: 110 (09/19 1800)  Labs: Recent Labs    11/29/17 1632  HGB 15.9  HCT 47.9  PLT 332  CREATININE 1.18    CrCl cannot be calculated (Unknown ideal weight.).   Medical History: Past Medical History:  Diagnosis Date  . Pulmonary embolus ([place])   . TIA (transient ischemic attack)     Medications:    Assessment: 52 yo M with history of multiple PE admitted for chest pain and SOB, not on anticoagulation PTA. Heparin started for new PE. CBC WNL  Goal of Therapy:  Heparin level 0.3-0.7 units/ml Monitor platelets by anticoagulation protocol: Yes   Plan:  Heparin 4200 units Iv bolus, 1200 units/hr infusion 6 hr heparin level Daily CBC, heparin level, monitor for s/s of bleeding   Harrietta Guardian, PharmD PGY1 Pharmacy Resident 11/29/2017    8:45 PM

## 2017-11-29 NOTE — ED Notes (Signed)
Contacted pharm for heparin. Landry Mellow will bring it to me.

## 2017-11-29 NOTE — ED Notes (Signed)
Patient returned from CT

## 2017-11-29 NOTE — H&P (Addendum)
History and Physical    Nadav Swindell WJX:[id number] DOB: 08-Nov-1965 DOA: 11/29/2017  PCP: Patient, No Pcp Per   Patient coming from: Home  Chief Complaint: Shortness of breath, chest pain  HPI: Mike Castillo is a 52 y.o. male with medical history significant for multiple PEs, patient presented to the ED with complaints of shortness of breath and chest pain, that started today.  Chest pain is worse with breathing.  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  His son moves Cars from state to state.  He drove to [place] and back.  Patient has a history of PEs.  PE 2003, ~ 1 week after neck(cervical) surgery.  Another PE 2011-he tells me he was using a lot of  Drugs at that including cocaine.  He took his anticoagulation for 3 months then decided to stop, without seeking his doctor's recommendation for duration of treatment.  Patient has never had a colonoscopy, reports some blood in his stools, multiple times about a month ago.  No abdominal pain, no vomiting of blood, no melena. No family history of blood clots.  ED Course: Tachycardic to 126.  Intermittent tachypnea to 22, systolic 213Y, O2 sats greater than 97% on room air.  Unremarkable CBC BMP.  I-STAT troponin negative.  EKG sinus tachycardia.  Unremarkable 2 view chest x-ray.  CTA chest-diffuse bilateral segmental and subsegmental pulmonary embolus identified in all lobes of both lungs.  Patient was started on heparin drip hospitalist was called to admit for pulmonary embolism.  Review of Systems: As per HPI all other systems reviewed and negative.  Past Medical History:  Diagnosis Date  . Pulmonary embolus ([place])   . TIA (transient ischemic attack)     Past Surgical History:  Procedure Laterality Date  . NECK SURGERY       reports that he has been smoking. He has been smoking about 0.25 packs per day. He has never used smokeless tobacco. He reports that he  drinks alcohol. He reports that he has current or past drug history. Drugs: Marijuana and Cocaine.  Allergies  Allergen Reactions  . Morphine And Related Itching   No family history of blood clots  Prior to Admission medications   Medication Sig Start Date End Date Taking? Authorizing Provider  diclofenac sodium (VOLTAREN) 1 % GEL Apply 2 g topically 3 (three) times daily. 12/28/16  Yes [provider]  Multiple Vitamin (THERA) TABS Take 1 tablet by mouth daily. 08/08/16  Yes [provider]  albuterol (PROVENTIL HFA;VENTOLIN HFA) 108 (90 Base) MCG/ACT inhaler Inhale 1 puff into the lungs every 4 (four) hours as needed for shortness of breath.    [provider]  aspirin EC 81 MG tablet Take 81 mg by mouth daily.    [provider]  atorvastatin (LIPITOR) 40 MG tablet Take 40 mg by mouth at bedtime. 08/08/16   [provider]  gabapentin (NEURONTIN) 600 MG tablet Take 600 mg by mouth 3 (three) times daily. 08/08/16   [provider]  lamoTRIgine (LAMICTAL) 100 MG tablet Take 100 mg by mouth daily.    [provider]  meloxicam (MOBIC) 15 MG tablet Take 15 mg by mouth daily. 10/08/17   [provider]  tiZANidine (ZANAFLEX) 4 MG tablet Take 4 mg by mouth every 6 (six) hours as needed for pain. 08/02/16   [provider]    Physical Exam: Vitals:   11/29/17  1800 11/29/17 1830 11/29/17 1845 11/29/17 1849  BP: 121/86 126/88 (!) 127/92   Pulse: (!) 110 (!) 102 (!) 104   Resp: 18 (!) 21 (!) 22   Temp:      TempSrc:      SpO2: 97% 97% 98%   Weight:    69.9 kg  Height:    5\' 8"  (1.727 m)    Constitutional: NAD, calm, comfortable Vitals:   11/29/17 1800 11/29/17 1830 11/29/17 1845 11/29/17 1849  BP: 121/86 126/88 (!) 127/92   Pulse: (!) 110 (!) 102 (!) 104   Resp: 18 (!) 21 (!) 22   Temp:      TempSrc:      SpO2: 97% 97% 98%   Weight:    69.9 kg  Height:    5\' 8"  (1.727 m)   Eyes: PERRL, lids and  conjunctivae normal ENMT: Mucous membranes are moist. Posterior pharynx clear of any exudate or lesions Neck: normal, supple, no masses, no thyromegaly Respiratory: clear to auscultation bilaterally, no wheezing, no crackles. Normal respiratory effort. No accessory muscle use.  Cardiovascular: Tachycardic but regular rate and rhythm, no murmurs / rubs / gallops. No extremity edema. 2+ pedal pulses.   Abdomen: no tenderness, no masses palpated. No hepatosplenomegaly. Bowel sounds positive.  Musculoskeletal: no clubbing / cyanosis. No joint deformity upper and lower extremities. Good ROM, no contractures. Normal muscle tone.  Skin: no rashes, lesions, ulcers. No induration Neurologic: CN 2-12 grossly intact.  Strength 5/5 in all 4.  Psychiatric: Normal judgment and insight. Alert and oriented x 3. Normal mood.   Labs on Admission: I have personally reviewed following labs and imaging studies  CBC: Recent Labs  Lab 11/29/17 1632  WBC 6.7  HGB 15.9  HCT 47.9  MCV 100.0  PLT 381   Basic Metabolic Panel: Recent Labs  Lab 11/29/17 1632  NA 141  K 3.8  CL 105  CO2 25  GLUCOSE 132*  BUN 15  CREATININE 1.18  CALCIUM 9.2    Radiological Exams on Admission: Dg Chest 2 View  Result Date: 11/29/2017 CLINICAL DATA:  Chest pain and shortness of breath today, history of multiple pulmonary emboli EXAM: CHEST - 2 VIEW COMPARISON:  None. FINDINGS: No active infiltrate or effusion is seen. Mediastinal and hilar contours are unremarkable. The heart is within normal limits in size. No bony abnormality is seen. IMPRESSION: No active cardiopulmonary disease. Electronically Signed   By: Ivar Drape M.D.   On: 11/29/2017 17:11   Ct Angio Chest Pe W And/or Wo Contrast  Result Date: 11/29/2017 CLINICAL DATA:  Chest pain and shortness of breath. Reported history of multiple PEs. EXAM: CT ANGIOGRAPHY CHEST WITH CONTRAST TECHNIQUE: Multidetector CT imaging of the chest was performed using the standard  protocol during bolus administration of intravenous contrast. Multiplanar CT image reconstructions and MIPs were obtained to evaluate the vascular anatomy. CONTRAST:  14mL ISOVUE-370 IOPAMIDOL (ISOVUE-370) INJECTION 76% COMPARISON:  None. FINDINGS: Cardiovascular: Segmental and subsegmental pulmonary embolus is identified in all lobes of both lungs. There is some lobar pulmonary artery involvement in the right middle lobe. No CT evidence for right heart strain. No thoracic aortic aneurysm. Mediastinum/Nodes: No mediastinal lymphadenopathy. There is no hilar lymphadenopathy. The esophagus has normal imaging features. There is no axillary lymphadenopathy. Lungs/Pleura: The central tracheobronchial airways are patent. Peripheral subpleural areas of ground-glass attenuation are identified in the right upper lobe and left upper lobe. Compressive atelectasis noted in the dependent lower lungs bilaterally. Subpleural in centrilobular emphysema  noted no pulmonary edema or pleural effusion. Upper Abdomen: 4 mm nonobstructing stone identified upper pole left kidney. Other bilateral renal calculi are evident. Musculoskeletal: No worrisome lytic or sclerotic osseous abnormality. Review of the MIP images confirms the above findings. IMPRESSION: 1. Diffuse bilateral segmental and subsegmental pulmonary embolus identified in all lobes of both lungs. No CT evidence for right heart strain. 2. Several peripheral areas of subpleural ground-glass attenuation in the right upper lobe may reflect areas of pulmonary infarction. Consider follow-up CT chest without contrast in 3 months to re-evaluate. 3. Bilateral nephrolithiasis. 4. Critical Value/emergent results were called by telephone at the time of interpretation on 11/29/2017 at 6:37 pm to Mr. Athena Masse, [place], who verbally acknowledged these results. Electronically Signed   By: Misty Stanley M.D.   On: 11/29/2017 18:37    EKG: Independently reviewed.  Sinus tachycardia.  QTC 432.  EKG  with insignificant Q waves.  Assessment/Plan Active Problems:   Pulmonary embolism ([facility])  Pulmonary embolism-dyspnea, chest pain.  TAchycardic. Provoked.  But history of provoked PE. CTA-diffuse bilateral segmental and subsegmental PE identified in all lobes of both lungs, no CT evidence of right heart strain. ?  Areas of pulmonary infarction. Negative i-STAT troponin.  EKG-no ST or T wave abnormalities. -Heparin drip started in ED, per pharmacy -Echo -Bilateral lower extremity Dopplers -CBC BMP a.m. -Follow-up chest CT without contrast in 3 months to reevaluate areas of pulmonary infarction-following CT recommendations. - Hydrate 1/2 Ns +20KCL 100cc/hr x 12 hrs -Patient reports multiple episodes of blood in stools a month ago, no melena, never had colonoscopy, now he is on anticoagulation, consider GI consult for recs in a.m -N.p.o. Midnight -Tramadol for pain  Polysubstance abuse-go home, tobacco, cocaine.  Reports last use of cocaine 4- 5 days ago.  Reports heavy alcohol intake but last alcoholic drink 5 days ago, prior to trip. Drinks beers, sixpack.  -CIWA PRN -Thiamine folate multivitamins -Emphasized abstinence -Nicotine patch  TIAs-reports 2 episodes of TIAs within the past year.  Both at outside facilities.  HIV as part of routine health screening   DVT prophylaxis: Heparin Code Status: Full Family Communication:  Disposition Plan: Per Rounding team  consults called: none Admission status: Obs, telemetry   Ejiroghene Arlyce Dice MD [facility] Pager [phone] From 6PM-2AM.  Otherwise please contact night-coverage [email or web address] Password [facility]  11/29/2017, 7:14 PM

## 2017-11-29 NOTE — ED Triage Notes (Signed)
Pt reports chest pain and SHOB starting today. 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 Pt reports hx of multiple PE's.

## 2017-11-30 ENCOUNTER — Observation Stay (HOSPITAL_COMMUNITY): Payer: Medicaid - Out of State

## 2017-11-30 ENCOUNTER — Observation Stay (HOSPITAL_BASED_OUTPATIENT_CLINIC_OR_DEPARTMENT_OTHER): Payer: Medicaid - Out of State

## 2017-11-30 DIAGNOSIS — I2699 Other pulmonary embolism without acute cor pulmonale: Secondary | ICD-10-CM | POA: Diagnosis not present

## 2017-11-30 DIAGNOSIS — I340 Nonrheumatic mitral (valve) insufficiency: Secondary | ICD-10-CM | POA: Diagnosis not present

## 2017-11-30 LAB — CBC
HEMATOCRIT: 42.9 % (ref 39.0–52.0)
Hemoglobin: 14.1 g/dL (ref 13.0–17.0)
MCH: 32.9 pg (ref 26.0–34.0)
MCHC: 32.9 g/dL (ref 30.0–36.0)
MCV: 100.2 fL — ABNORMAL HIGH (ref 78.0–100.0)
PLATELETS: 303 10*3/uL (ref 150–400)
RBC: 4.28 MIL/uL (ref 4.22–5.81)
RDW: 11.6 % (ref 11.5–15.5)
WBC: 8 10*3/uL (ref 4.0–10.5)

## 2017-11-30 LAB — BASIC METABOLIC PANEL
Anion gap: 10 (ref 5–15)
BUN: 18 mg/dL (ref 6–20)
CO2: 25 mmol/L (ref 22–32)
CREATININE: 1.02 mg/dL (ref 0.61–1.24)
Calcium: 8.9 mg/dL (ref 8.9–10.3)
Chloride: 104 mmol/L (ref 98–111)
GFR calc Af Amer: >60 mL/min (ref >60)
GLUCOSE: 97 mg/dL (ref 70–99)
Potassium: 3.9 mmol/L (ref 3.5–5.1)
Sodium: 139 mmol/L (ref 135–145)

## 2017-11-30 LAB — ECHOCARDIOGRAM COMPLETE
Height: 68 in
Weight: 2464 oz

## 2017-11-30 LAB — HEPARIN LEVEL (UNFRACTIONATED)
HEPARIN UNFRACTIONATED: 0.4 [IU]/mL (ref 0.30–0.70)
HEPARIN UNFRACTIONATED: 0.21 [IU]/mL — AB (ref 0.30–0.70)
Heparin Unfractionated: 0.55 IU/mL (ref 0.30–0.70)

## 2017-11-30 LAB — HIV ANTIBODY (ROUTINE TESTING W REFLEX): HIV Screen 4th Generation wRfx: NONREACTIVE

## 2017-11-30 MED ORDER — DIPHENHYDRAMINE HCL 25 MG PO CAPS
25.0000 mg | ORAL_CAPSULE | Freq: Once | ORAL | Status: AC
  Administered 2017-11-30: 25 mg via ORAL
  Filled 2017-11-30: qty 1

## 2017-11-30 MED ORDER — HYDROMORPHONE HCL 1 MG/ML IJ SOLN
0.5000 mg | Freq: Once | INTRAMUSCULAR | Status: AC
  Administered 2017-11-30: 0.5 mg via INTRAVENOUS
  Filled 2017-11-30: qty 1

## 2017-11-30 MED ORDER — OXYCODONE HCL 5 MG PO TABS
5.0000 mg | ORAL_TABLET | Freq: Four times a day (QID) | ORAL | Status: DC | PRN
  Administered 2017-11-30 (×2): 5 mg via ORAL
  Filled 2017-11-30 (×2): qty 1

## 2017-11-30 MED ORDER — HEPARIN BOLUS VIA INFUSION
1000.0000 [IU] | Freq: Once | INTRAVENOUS | Status: AC
  Administered 2017-11-30: 1000 [IU] via INTRAVENOUS
  Filled 2017-11-30: qty 1000

## 2017-11-30 MED ORDER — OXYCODONE HCL 5 MG PO TABS
5.0000 mg | ORAL_TABLET | Freq: Four times a day (QID) | ORAL | Status: DC | PRN
  Administered 2017-12-01: 5 mg via ORAL
  Filled 2017-11-30: qty 1

## 2017-11-30 NOTE — Care Management Note (Addendum)
Case Management Note  Patient Details  Name: Mike Castillo MRN: [id number] Date of Birth: [birth year]  Subjective/Objective:   From home, PE, DVT,  benefit check in process for eliquis/xarelto. Patient has [place] out of state, [place]),  NCM will give patient the 30 day free coupon for which ever anticoag MD chooses, patient states he will get his other medications from the [facility] in [place] because he can not use his out of state medicaid card in [place].  He will need printed scripts.  9/23 Tomi Bamberger RN, BSN- NCM gave the 30 day free coupon to the pharmacist, will try to do the [facility] pharmacy for the [place] only.                 Action/Plan: DC home when ready.  Expected Discharge Date:  12/01/17               Expected Discharge Plan:  Home/Self Care  In-House Referral:     Discharge planning Services  CM Consult, Medication Assistance  Post Acute Care Choice:    Choice offered to:     DME Arranged:    DME Agency:     HH Arranged:    HH Agency:     Status of Service:  Completed, signed off  If discussed at [employer] of Stay Meetings, dates discussed:    Additional Comments:  Zenon Mayo, RN 11/30/2017, 3:10 PM

## 2017-11-30 NOTE — Progress Notes (Signed)
[place] for heparin Indication: pulmonary embolus  Allergies  Allergen Reactions  . Morphine And Related Itching    Patient Measurements: Height: 5\' 8"  (172.7 cm) Weight: 154 lb (69.9 kg) IBW/kg (Calculated) : 68.4 Heparin Dosing Weight: 70  Vital Signs: Temp: 98.5 F (36.9 C) (09/19 2319) Temp Source: Oral (09/19 2319) BP: 122/86 (09/19 2319) Pulse Rate: 102 (09/19 2319)  Labs: Recent Labs    11/29/17 1632 11/30/17 0219  HGB 15.9 14.1  HCT 47.9 42.9  PLT 332 303  HEPARINUNFRC  --  0.40  CREATININE 1.18 1.02    Estimated Creatinine Clearance: 82.9 mL/min (by C-G formula based on SCr of 1.02 mg/dL).   Medical History: Past Medical History:  Diagnosis Date  . Anxiety   . History of kidney stones   . Pulmonary embolus ([place])   . Stroke [facility])    TIA  . TIA (transient ischemic attack)     Medications:    Assessment: 52 yo M with history of multiple PE admitted for chest pain and SOB, not on anticoagulation PTA. Heparin started for new PE. CBC WNL Heparin level this morning therapeutic.  Goal of Therapy:  Heparin level 0.3-0.7 units/ml Monitor platelets by anticoagulation protocol: Yes   Plan:  Cont Heparin 1200 units/hr 6 hr heparin level to confirm Daily CBC, heparin level, monitor for s/s of bleeding   Thanks for allowing pharmacy to be a part of this patient's care.  Excell Seltzer, PharmD Clinical Pharmacist 11/30/2017    3:40 AM

## 2017-11-30 NOTE — Progress Notes (Signed)
[place] for heparin Indication: pulmonary embolus  Allergies  Allergen Reactions  . Morphine And Related Itching    Patient Measurements: Height: 5\' 8"  (172.7 cm) Weight: 154 lb (69.9 kg) IBW/kg (Calculated) : 68.4 Heparin Dosing Weight: 70  Vital Signs: Temp: 98.4 F (36.9 C) (09/20 1540) Temp Source: Oral (09/20 1540) BP: 125/85 (09/20 1540) Pulse Rate: 99 (09/20 1540)  Labs: Recent Labs    11/29/17 1632 11/30/17 0219 11/30/17 0929 11/30/17 1939  HGB 15.9 14.1  --   --   HCT 47.9 42.9  --   --   PLT 332 303  --   --   HEPARINUNFRC  --  0.40 0.21* 0.55  CREATININE 1.18 1.02  --   --     Estimated Creatinine Clearance: 82.9 mL/min (by C-G formula based on SCr of 1.02 mg/dL).       Assessment: 52 yo M with history of multiple PE admitted for chest pain and SOB, not on anticoagulation PTA. Heparin started for new PE. CBC WNL  Heparin level now therapeutic at 0.55  Goal of Therapy:  Heparin level 0.3-0.7 units/ml Monitor platelets by anticoagulation protocol: Yes   Plan:  Continue heparin at 1350 units / hr Next heparin level in AM Continue to monitor H&H and platelets   Thank you for allowing [place] to participate in this patients care. Anette Guarneri, PharmD Please utilize Amion (under [place]) for appropriate number for your unit pharmacist. 11/30/2017 8:31 PM

## 2017-11-30 NOTE — Progress Notes (Signed)
PROGRESS NOTE    Mike Castillo  OYD:[id number] DOB: 1965/03/24 DOA: 11/29/2017 PCP: Patient, No Pcp Per  Brief Narrative:Mike Castillo is a 52 y.o. male with medical history significant for multiple PEs, patient presented to the ED with complaints of shortness of breath and chest pain. Chest pain is worse with breathing.  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  His son moves Cars from state to state.  He drove to [place] and back.  Patient has a history of PEs.  PE 2003, ~ 1 week after neck(cervical) surgery.  Another PE 2011- ED Course: Tachycardic to 126.  Intermittent tachypnea to 22, systolic 672C, O2 sats greater than 97% on room air.  Unremarkable CBC BMP.  I-STAT troponin negative.  EKG sinus tachycardia.  Unremarkable 2 view chest x-ray.  CTA chest-diffuse bilateral segmental and subsegmental pulmonary embolus identified in all lobes of both lungs.  Patient was started on heparin drip hospitalist was called to admit for pulmonary embolism.   Assessment & Plan:  Acute diffuse bilateral pulmonary emboli  -suspected to be provoked, following long distance travel in a truck for the last 3 days - With moderate hypoxemia at this time along with tachycardia -Continue IV heparin today, will transition to DOACs tomorrow, pending hemodynamic stability -follow-up echocardiogram to evaluate for RV dysfunction -Stop ivf -will need anticoagulation for at least 3-6 months, according to patient previous episodes of VT E were provoked as well  History of recent scant BRBPR -for over one month ago -Patient reports having a colonoscopy a year back, multiple polyps were removed -no indication for GI evaluation at this time unless active bleeding ensues  Polysubstance abuse-go home, tobacco, cocaine.  Reports last use of cocaine 4- 5 days ago.  Reports heavy alcohol intake but last alcoholic drink 5 days ago, prior to  trip. -Thiamine folate multivitamins -Emphasized abstinence -Nicotine patch  TIAs-reports 2 episodes of TIAs within the past year -stop. Aspirin since he'll be on anticoagulation  HIV as part of routine health screening   DVT prophylaxis: Heparin Code Status: Full Family Communication:  Disposition Plan: Per Round[place] team   Consultants:   none   Procedures:   Antimicrobials:    Subjective: -short of breath with minimal activity -Diffuse pleuritic chest pain  Objective: Vitals:   11/29/17 2000 11/29/17 2039 11/29/17 2319 11/30/17 0734  BP: (!) 150/97 (!) 130/91 122/86 123/87  Pulse: (!) 105 (!) 102 (!) 102 88  Resp: 19 19 19 17   Temp:  98.5 F (36.9 C) 98.5 F (36.9 C) 97.6 F (36.4 C)  TempSrc:  Oral Oral Oral  SpO2: 98% 97% 97% 97%  Weight:      Height:        Intake/Output Summary (Last 24 hours) at 11/30/2017 9470 Last data filed at 11/30/2017 9628 Gross per 24 hour  Intake 1326.97 ml  Output 700 ml  Net 626.97 ml   Filed Weights   11/29/17 1849  Weight: 69.9 kg    Examination:  General exam: uncomfortable appearing, mild to Respiratory system: to air movement, and decreased at both bases Cardiovascular system: as well as 2/tachycardic, no murmurs Gastrointestinal system: Abdomen is nondistended, soft and nontender.Normal bowel sounds heard. Central nervous system: Alert and oriented. No focal neurological deficits. Extremities: Symmetric 5 x 5 power. Skin: No rashes, lesions or ulcers Psychiatry: Judgement and insight appear normal. Mood & affect appropriate.     Data Reviewed:  CBC: Recent Labs  Lab 11/29/17 1632 11/30/17 0219  WBC 6.7 8.0  HGB 15.9 14.1  HCT 47.9 42.9  MCV 100.0 100.2*  PLT 332 329   Basic Metabolic Panel: Recent Labs  Lab 11/29/17 1632 11/30/17 0219  NA 141 139  K 3.8 3.9  CL 105 104  CO2 25 25  GLUCOSE 132* 97  BUN 15 18  CREATININE 1.18 1.02  CALCIUM 9.2 8.9   GFR: Estimated Creatinine  Clearance: 82.9 mL/min (by C-G formula based on SCr of 1.02 mg/dL). Liver Function Tests: No results for input(s): AST, ALT, ALKPHOS, BILITOT, PROT, ALBUMIN in the last 168 hours. No results for input(s): LIPASE, AMYLASE in the last 168 hours. No results for input(s): AMMONIA in the last 168 hours. Coagulation Profile: No results for input(s): INR, PROTIME in the last 168 hours. Cardiac Enzymes: No results for input(s): CKTOTAL, CKMB, CKMBINDEX, TROPONINI in the last 168 hours. BNP (last 3 results) No results for input(s): PROBNP in the last 8760 hours. HbA1C: No results for input(s): HGBA1C in the last 72 hours. CBG: No results for input(s): GLUCAP in the last 168 hours. Lipid Profile: No results for input(s): CHOL, HDL, LDLCALC, TRIG, CHOLHDL, LDLDIRECT in the last 72 hours. Thyroid Function Tests: No results for input(s): TSH, T4TOTAL, FREET4, T3FREE, THYROIDAB in the last 72 hours. Anemia Panel: No results for input(s): VITAMINB12, FOLATE, FERRITIN, TIBC, IRON, RETICCTPCT in the last 72 hours. Urine analysis: No results found for: COLORURINE, APPEARANCEUR, LABSPEC, PHURINE, GLUCOSEU, HGBUR, BILIRUBINUR, KETONESUR, PROTEINUR, UROBILINOGEN, NITRITE, LEUKOCYTESUR Sepsis Labs: @LABRCNTIP (procalcitonin:4,lacticidven:4)  )No results found for this or any previous visit (from the past 240 hour(s)).       Radiology Studies: Dg Chest 2 View  Result Date: 11/29/2017 CLINICAL DATA:  Chest pain and shortness of breath today, history of multiple pulmonary emboli EXAM: CHEST - 2 VIEW COMPARISON:  None. FINDINGS: No active infiltrate or effusion is seen. Mediastinal and hilar contours are unremarkable. The heart is within normal limits in size. No bony abnormality is seen. IMPRESSION: No active cardiopulmonary disease. Electronically Signed   By: Ivar Drape M.D.   On: 11/29/2017 17:11   Ct Angio Chest Pe W And/or Wo Contrast  Result Date: 11/29/2017 CLINICAL DATA:  Chest pain and  shortness of breath. Reported history of multiple PEs. EXAM: CT ANGIOGRAPHY CHEST WITH CONTRAST TECHNIQUE: Multidetector CT imaging of the chest was performed using the standard protocol during bolus administration of intravenous contrast. Multiplanar CT image reconstructions and MIPs were obtained to evaluate the vascular anatomy. CONTRAST:  113mL ISOVUE-370 IOPAMIDOL (ISOVUE-370) INJECTION 76% COMPARISON:  None. FINDINGS: Cardiovascular: Segmental and subsegmental pulmonary embolus is identified in all lobes of both lungs. There is some lobar pulmonary artery involvement in the right middle lobe. No CT evidence for right heart strain. No thoracic aortic aneurysm. Mediastinum/Nodes: No mediastinal lymphadenopathy. There is no hilar lymphadenopathy. The esophagus has normal imaging features. There is no axillary lymphadenopathy. Lungs/Pleura: The central tracheobronchial airways are patent. Peripheral subpleural areas of ground-glass attenuation are identified in the right upper lobe and left upper lobe. Compressive atelectasis noted in the dependent lower lungs bilaterally. Subpleural in centrilobular emphysema noted no pulmonary edema or pleural effusion. Upper Abdomen: 4 mm nonobstructing stone identified upper pole left kidney. Other bilateral renal calculi are evident. Musculoskeletal: No worrisome lytic or sclerotic osseous abnormality. Review of the MIP images confirms the above findings. IMPRESSION: 1. Diffuse bilateral segmental and subsegmental pulmonary embolus identified in all lobes of both lungs. No CT evidence  for right heart strain. 2. Several peripheral areas of subpleural ground-glass attenuation in the right upper lobe may reflect areas of pulmonary infarction. Consider follow-up CT chest without contrast in 3 months to re-evaluate. 3. Bilateral nephrolithiasis. 4. Critical Value/emergent results were called by telephone at the time of interpretation on 11/29/2017 at 6:37 pm to Mr. Athena Masse, [place], who  verbally acknowledged these results. Electronically Signed   By: Misty Stanley M.D.   On: 11/29/2017 18:37        Scheduled Meds: . folic acid  1 mg Oral Daily  . [START ON 12/01/2017] Influenza vac split quadrivalent PF  0.5 mL Intramuscular Tomorrow-1000  . multivitamin with minerals  1 tablet Oral Daily  . nicotine  7 mg Transdermal QHS  . [START ON 12/01/2017] pneumococcal 23 valent vaccine  0.5 mL Intramuscular Tomorrow-1000  . thiamine  100 mg Oral Daily   Or  . thiamine  100 mg Intravenous Daily  . traMADol  50 mg Oral Q6H   Continuous Infusions: . heparin 1,200 Units/hr (11/29/17 1938)     LOS: 0 days    Time spent: 52min    Domenic Polite, MD [facility] Page via [email or web address], password [email or web address] After 7PM please contact night-coverage  11/30/2017, 9:52 AM

## 2017-11-30 NOTE — Care Management (Signed)
11-30-17   BENEFIT CHECK :  # 3.  PRIMARY INS : [place]                                   EFF-DAY- 11-11-17                                   CO-PAY-  $ 3.80    NO  SECONDARY INS

## 2017-11-30 NOTE — Progress Notes (Signed)
  Echocardiogram 2D Echocardiogram has been performed.  Mike Castillo 11/30/2017, 8:56 AM

## 2017-11-30 NOTE — Progress Notes (Signed)
[place] for heparin Indication: pulmonary embolus  Allergies  Allergen Reactions  . Morphine And Related Itching    Patient Measurements: Height: 5\' 8"  (172.7 cm) Weight: 154 lb (69.9 kg) IBW/kg (Calculated) : 68.4 Heparin Dosing Weight: 70  Vital Signs: Temp: 97.6 F (36.4 C) (09/20 0734) Temp Source: Oral (09/20 0734) BP: 123/87 (09/20 0734) Pulse Rate: 88 (09/20 0734)  Labs: Recent Labs    11/29/17 1632 11/30/17 0219 11/30/17 0929  HGB 15.9 14.1  --   HCT 47.9 42.9  --   PLT 332 303  --   HEPARINUNFRC  --  0.40 0.21*  CREATININE 1.18 1.02  --     Estimated Creatinine Clearance: 82.9 mL/min (by C-G formula based on SCr of 1.02 mg/dL).   Medical History: Past Medical History:  Diagnosis Date  . Anxiety   . History of kidney stones   . Pulmonary embolus ([place])   . Stroke [facility])    TIA  . TIA (transient ischemic attack)     Medications:    Assessment: 52 yo M with history of multiple PE admitted for chest pain and SOB, not on anticoagulation PTA. Heparin started for new PE. CBC WNL Heparin level came back SUBtherpeautic at 0.21. Verified with RN that there have been no interruptions or issues with infusion. No bleeding noted.  Goal of Therapy:  Heparin level 0.3-0.7 units/ml Monitor platelets by anticoagulation protocol: Yes   Plan:  Give 1000 units bolus x 1 Increase heparin infusion to 1350 units/hr Check anti-Xa level in 6 hours and daily while on heparin Continue to monitor H&H and platelets   Thank you for allowing [place] to participate in this patients care.   Jens Som, PharmD Please utilize Amion (under [place]) for appropriate number for your unit pharmacist. 11/30/2017 12:55 PM

## 2017-11-30 NOTE — Progress Notes (Signed)
*  Preliminary Results* Bilateral lower extremity venous duplex completed. Right lower extremity is positive for acute deep vein thrombosis in the popliteal vein.  Left lower extremity is negative for deep vein thrombosis.  There is no evidence of Baker's cyst bilaterally. Results given to Rn.   11/30/2017 8:52 AM  Abram Sander

## 2017-12-01 DIAGNOSIS — Z8673 Personal history of transient ischemic attack (TIA), and cerebral infarction without residual deficits: Secondary | ICD-10-CM | POA: Diagnosis not present

## 2017-12-01 DIAGNOSIS — Z79899 Other long term (current) drug therapy: Secondary | ICD-10-CM | POA: Diagnosis not present

## 2017-12-01 DIAGNOSIS — Z23 Encounter for immunization: Secondary | ICD-10-CM | POA: Diagnosis not present

## 2017-12-01 DIAGNOSIS — F17210 Nicotine dependence, cigarettes, uncomplicated: Secondary | ICD-10-CM | POA: Diagnosis present

## 2017-12-01 DIAGNOSIS — F1410 Cocaine abuse, uncomplicated: Secondary | ICD-10-CM | POA: Diagnosis present

## 2017-12-01 DIAGNOSIS — Z7982 Long term (current) use of aspirin: Secondary | ICD-10-CM | POA: Diagnosis not present

## 2017-12-01 DIAGNOSIS — I2699 Other pulmonary embolism without acute cor pulmonale: Secondary | ICD-10-CM | POA: Diagnosis present

## 2017-12-01 DIAGNOSIS — R0902 Hypoxemia: Secondary | ICD-10-CM | POA: Diagnosis present

## 2017-12-01 DIAGNOSIS — Z86711 Personal history of pulmonary embolism: Secondary | ICD-10-CM | POA: Diagnosis not present

## 2017-12-01 DIAGNOSIS — Z791 Long term (current) use of non-steroidal anti-inflammatories (NSAID): Secondary | ICD-10-CM | POA: Diagnosis not present

## 2017-12-01 LAB — CBC
HEMATOCRIT: 48.1 % (ref 39.0–52.0)
Hemoglobin: 15.7 g/dL (ref 13.0–17.0)
MCH: 33.1 pg (ref 26.0–34.0)
MCHC: 32.6 g/dL (ref 30.0–36.0)
MCV: 101.3 fL — ABNORMAL HIGH (ref 78.0–100.0)
PLATELETS: 305 10*3/uL (ref 150–400)
RBC: 4.75 MIL/uL (ref 4.22–5.81)
RDW: 11.3 % — AB (ref 11.5–15.5)
WBC: 6.4 10*3/uL (ref 4.0–10.5)

## 2017-12-01 LAB — BASIC METABOLIC PANEL
ANION GAP: 9 (ref 5–15)
BUN: 11 mg/dL (ref 6–20)
CHLORIDE: 100 mmol/L (ref 98–111)
CO2: 29 mmol/L (ref 22–32)
Calcium: 9.5 mg/dL (ref 8.9–10.3)
Creatinine, Ser: 0.96 mg/dL (ref 0.61–1.24)
GFR calc Af Amer: >60 mL/min (ref >60)
GLUCOSE: 81 mg/dL (ref 70–99)
POTASSIUM: 4.1 mmol/L (ref 3.5–5.1)
Sodium: 138 mmol/L (ref 135–145)

## 2017-12-01 LAB — HEPARIN LEVEL (UNFRACTIONATED): Heparin Unfractionated: 0.46 IU/mL (ref 0.30–0.70)

## 2017-12-01 MED ORDER — DIPHENHYDRAMINE HCL 25 MG PO CAPS
25.0000 mg | ORAL_CAPSULE | Freq: Three times a day (TID) | ORAL | Status: AC | PRN
  Administered 2017-12-01 (×2): 25 mg via ORAL
  Filled 2017-12-01 (×2): qty 1

## 2017-12-01 MED ORDER — OXYCODONE HCL 5 MG PO TABS: 5.0000 mg | ORAL_TABLET | Freq: Four times a day (QID) | ORAL | Status: DC | PRN

## 2017-12-01 MED ORDER — RIVAROXABAN 15 MG PO TABS
15.0000 mg | ORAL_TABLET | Freq: Two times a day (BID) | ORAL | Status: AC
  Administered 2017-12-01 – 2017-12-02 (×2): 15 mg via ORAL
  Filled 2017-12-01 (×3): qty 1

## 2017-12-01 MED ORDER — RIVAROXABAN 15 MG PO TABS: 15.0000 mg | ORAL_TABLET | Freq: Two times a day (BID) | ORAL | Status: DC

## 2017-12-01 NOTE — Plan of Care (Signed)
Discussed plan of care for the evening with patient.  Explained patient must adopt life-style changes as a result of blood clots throughout his body.  This RN recommended patient make frequent stops to walk and stretch when driving long distances.Pt stated he has plans to go to [facility] next month.  This RN suggested he stand up and walk several times during the flight.  Emphasized using the call bell when assistance is needed.  Good teach back displayed.

## 2017-12-01 NOTE — Progress Notes (Signed)
PROGRESS NOTE    Mike Castillo  RXV:[id number] DOB: 10-15-1965 DOA: 11/29/2017 PCP: Patient, No Pcp Per  Brief Narrative:Mike Castillo is a 52 y.o. male with medical history significant for multiple PEs, patient presented to the ED with complaints of shortness of breath and chest pain. Chest pain is worse with breathing.  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  His son moves Cars from state to state.  He drove to [place] and back.  Patient has a history of PEs.  PE 2003, ~ 1 week after neck(cervical) surgery.  Another PE 2011- ED Course: Tachycardic to 126.  Intermittent tachypnea to 22, systolic 509T, O2 sats greater than 97% on room air.  Unremarkable CBC BMP.  I-STAT troponin negative.  EKG sinus tachycardia.  Unremarkable 2 view chest x-ray.  CTA chest-diffuse bilateral segmental and subsegmental pulmonary embolus identified in all lobes of both lungs.  Patient was started on heparin drip hospitalist was called to admit for pulmonary embolism.   Assessment & Plan:  Acute diffuse bilateral pulmonary emboli  -suspected to be provoked, following long distance travel in a truck for the last 3 days - clinically improving - STOP IV heparin, transition to oral Xarelto today -will need anticoagulation for at least 3-6 months, according to patient previous episodes of VT E were provoked as well  History of recent scant BRBPR -for over one month ago -Patient reports having a colonoscopy a year back, multiple polyps were removed -no indication for GI evaluation at this time unless active bleeding ensues  Polysubstance abuse-go home, tobacco, cocaine.  Reports last use of cocaine 4- 5 days ago.  Reports heavy alcohol intake but last alcoholic drink 5 days ago, prior to trip. -Thiamine folate multivitamins -Emphasized abstinence -Nicotine patch  TIAs-reports 2 episodes of TIAs within the past year -stopped. Aspirin  since he'll be on anticoagulation  DVT prophylaxis: Heparin Code Status: Full Family Communication:  Disposition Plan: home tomorrow if stable  Consultants:   none   Procedures:   Antimicrobials:    Subjective:  -breathing improving, still short of breath with activity  Objective: Vitals:   11/30/17 1540 11/30/17 2354 12/01/17 0804 12/01/17 1200  BP: 125/85 125/88 95/68 124/81  Pulse: 99 89 81   Resp: 16 18 20    Temp: 98.4 F (36.9 C) 98.1 F (36.7 C) 97.9 F (36.6 C) 97.7 F (36.5 C)  TempSrc: Oral Oral Oral Oral  SpO2: 97% 100% 98% 93%  Weight:      Height:        Intake/Output Summary (Last 24 hours) at 12/01/2017 1352 Last data filed at 12/01/2017 0543 Gross per 24 hour  Intake 258.81 ml  Output 750 ml  Net -491.19 ml   Filed Weights   11/29/17 1849  Weight: 69.9 kg    Examination:  Gen: Awake, Alert, Oriented X 3,  HEENT: PERRLA, Neck supple, no JVD Lungs:  CTAB CVS: RRR,No Gallops,Rubs or new Murmurs Abd: soft, Non tender, non distended, BS present Extremities: No Cyanosis, Clubbing or edema Skin: no new rashes     Data Reviewed:   CBC: Recent Labs  Lab 11/29/17 1632 11/30/17 0219 12/01/17 0307  WBC 6.7 8.0 6.4  HGB 15.9 14.1 15.7  HCT 47.9 42.9 48.1  MCV 100.0 100.2* 101.3*  PLT 332 303 267   Basic Metabolic Panel: Recent Labs  Lab 11/29/17 1632 11/30/17 0219 12/01/17 0307  NA 141 139 138  K 3.8 3.9 4.1  CL 105 104 100  CO2 25 25 29   GLUCOSE 132* 97 81  BUN 15 18 11   CREATININE 1.18 1.02 0.96  CALCIUM 9.2 8.9 9.5   GFR: Estimated Creatinine Clearance: 88.1 mL/min (by C-G formula based on SCr of 0.96 mg/dL). Liver Function Tests: No results for input(s): AST, ALT, ALKPHOS, BILITOT, PROT, ALBUMIN in the last 168 hours. No results for input(s): LIPASE, AMYLASE in the last 168 hours. No results for input(s): AMMONIA in the last 168 hours. Coagulation Profile: No results for input(s): INR, PROTIME in the last 168  hours. Cardiac Enzymes: No results for input(s): CKTOTAL, CKMB, CKMBINDEX, TROPONINI in the last 168 hours. BNP (last 3 results) No results for input(s): PROBNP in the last 8760 hours. HbA1C: No results for input(s): HGBA1C in the last 72 hours. CBG: No results for input(s): GLUCAP in the last 168 hours. Lipid Profile: No results for input(s): CHOL, HDL, LDLCALC, TRIG, CHOLHDL, LDLDIRECT in the last 72 hours. Thyroid Function Tests: No results for input(s): TSH, T4TOTAL, FREET4, T3FREE, THYROIDAB in the last 72 hours. Anemia Panel: No results for input(s): VITAMINB12, FOLATE, FERRITIN, TIBC, IRON, RETICCTPCT in the last 72 hours. Urine analysis: No results found for: COLORURINE, APPEARANCEUR, LABSPEC, PHURINE, GLUCOSEU, HGBUR, BILIRUBINUR, KETONESUR, PROTEINUR, UROBILINOGEN, NITRITE, LEUKOCYTESUR Sepsis Labs: @LABRCNTIP (procalcitonin:4,lacticidven:4)  )No results found for this or any previous visit (from the past 240 hour(s)).       Radiology Studies: Dg Chest 2 View  Result Date: 11/29/2017 CLINICAL DATA:  Chest pain and shortness of breath today, history of multiple pulmonary emboli EXAM: CHEST - 2 VIEW COMPARISON:  None. FINDINGS: No active infiltrate or effusion is seen. Mediastinal and hilar contours are unremarkable. The heart is within normal limits in size. No bony abnormality is seen. IMPRESSION: No active cardiopulmonary disease. Electronically Signed   By: Ivar Drape M.D.   On: 11/29/2017 17:11   Ct Angio Chest Pe W And/or Wo Contrast  Result Date: 11/29/2017 CLINICAL DATA:  Chest pain and shortness of breath. Reported history of multiple PEs. EXAM: CT ANGIOGRAPHY CHEST WITH CONTRAST TECHNIQUE: Multidetector CT imaging of the chest was performed using the standard protocol during bolus administration of intravenous contrast. Multiplanar CT image reconstructions and MIPs were obtained to evaluate the vascular anatomy. CONTRAST:  186mL ISOVUE-370 IOPAMIDOL (ISOVUE-370)  INJECTION 76% COMPARISON:  None. FINDINGS: Cardiovascular: Segmental and subsegmental pulmonary embolus is identified in all lobes of both lungs. There is some lobar pulmonary artery involvement in the right middle lobe. No CT evidence for right heart strain. No thoracic aortic aneurysm. Mediastinum/Nodes: No mediastinal lymphadenopathy. There is no hilar lymphadenopathy. The esophagus has normal imaging features. There is no axillary lymphadenopathy. Lungs/Pleura: The central tracheobronchial airways are patent. Peripheral subpleural areas of ground-glass attenuation are identified in the right upper lobe and left upper lobe. Compressive atelectasis noted in the dependent lower lungs bilaterally. Subpleural in centrilobular emphysema noted no pulmonary edema or pleural effusion. Upper Abdomen: 4 mm nonobstructing stone identified upper pole left kidney. Other bilateral renal calculi are evident. Musculoskeletal: No worrisome lytic or sclerotic osseous abnormality. Review of the MIP images confirms the above findings. IMPRESSION: 1. Diffuse bilateral segmental and subsegmental pulmonary embolus identified in all lobes of both lungs. No CT evidence for right heart strain. 2. Several peripheral areas of subpleural ground-glass attenuation in the right upper lobe may reflect areas of pulmonary infarction. Consider follow-up CT chest without contrast in 3 months to re-evaluate. 3. Bilateral nephrolithiasis. 4. Critical Value/emergent  results were called by telephone at the time of interpretation on 11/29/2017 at 6:37 pm to Mr. Athena Masse, [place], who verbally acknowledged these results. Electronically Signed   By: Misty Stanley M.D.   On: 11/29/2017 18:37        Scheduled Meds: . folic acid  1 mg Oral Daily  . multivitamin with minerals  1 tablet Oral Daily  . nicotine  7 mg Transdermal QHS  . pneumococcal 23 valent vaccine  0.5 mL Intramuscular Tomorrow-1000  . rivaroxaban  15 mg Oral BID WC  . [START ON  12/02/2017] Rivaroxaban  15 mg Oral BID WC  . thiamine  100 mg Oral Daily  . traMADol  50 mg Oral Q6H   Continuous Infusions:    LOS: 0 days    Time spent: 78min    Domenic Polite, MD [facility] Page via [email or web address], password [email or web address] After 7PM please contact night-coverage  12/01/2017, 1:52 PM

## 2017-12-01 NOTE — Progress Notes (Addendum)
ANTICOAGULATION CONSULT NOTE - follow-up  Pharmacy Consult for heparin & xarelto transition Indication: pulmonary embolus  Allergies  Allergen Reactions  . Morphine And Related Itching    Patient Measurements: Height: 5\' 8"  (172.7 cm) Weight: 154 lb (69.9 kg) IBW/kg (Calculated) : 68.4 Heparin Dosing Weight: 70  Vital Signs: Temp: 97.9 F (36.6 C) (09/21 0804) Temp Source: Oral (09/21 0804) BP: 95/68 (09/21 0804) Pulse Rate: 81 (09/21 0804)  Labs: Recent Labs    11/29/17 1632  11/30/17 0219 11/30/17 0929 11/30/17 1939 12/01/17 0307  HGB 15.9  --  14.1  --   --  15.7  HCT 47.9  --  42.9  --   --  48.1  PLT 332  --  303  --   --  305  HEPARINUNFRC  --    < > 0.40 0.21* 0.55 0.46  CREATININE 1.18  --  1.02  --   --  0.96   < > = values in this interval not displayed.    Estimated Creatinine Clearance: 88.1 mL/min (by C-G formula based on SCr of 0.96 mg/dL).       Assessment: 52 yo M with history of multiple PE admitted for chest pain and SOB, not on anticoagulation PTA. Heparin started for new PE as of 9/19. Transition to xarelto today.   9/21:   Heparin level therapeutic at 0.46    CBC stable w/ Hgb 15.7, Hct 48.1, platelets 305  No signs or symptoms of bleeding.  Will transition to Xarelto 15mg  BID for 21 days then   Goal of Therapy:  Heparin level 0.3-0.7 units/ml Monitor platelets by anticoagulation protocol: Yes   Plan:  Stop heparin infusion. Start Xarleto 15mg  twice daily PO with meals for 21 days, followed by Xarelto 20mg  once daily with food. Continue to monitor H&H and platelets   Thank you for allowing pharmacy to be a part of this patient's care.  Tamela Gammon, PharmD 12/01/2017 10:40 AM PGY-1 Pharmacy Resident Direct Phone: [phone] Please check [email or web address] for unit-specific pharmacist phone numbers

## 2017-12-02 LAB — CBC
HEMATOCRIT: 47.2 % (ref 39.0–52.0)
HEMOGLOBIN: 15.5 g/dL (ref 13.0–17.0)
MCH: 32.9 pg (ref 26.0–34.0)
MCHC: 32.8 g/dL (ref 30.0–36.0)
MCV: 100.2 fL — ABNORMAL HIGH (ref 78.0–100.0)
Platelets: 329 10*3/uL (ref 150–400)
RBC: 4.71 MIL/uL (ref 4.22–5.81)
RDW: 11.1 % — ABNORMAL LOW (ref 11.5–15.5)
WBC: 5 10*3/uL (ref 4.0–10.5)

## 2017-12-02 LAB — HEPARIN LEVEL (UNFRACTIONATED): Heparin Unfractionated: >2.2 IU/mL — ABNORMAL HIGH (ref 0.30–0.70)

## 2017-12-02 MED ORDER — RIVAROXABAN 20 MG PO TABS: 20.0000 mg | ORAL_TABLET | Freq: Every day | ORAL | Status: DC

## 2017-12-02 MED ORDER — RIVAROXABAN 15 MG PO TABS
15.0000 mg | ORAL_TABLET | Freq: Two times a day (BID) | ORAL | Status: DC
  Administered 2017-12-02 – 2017-12-03 (×3): 15 mg via ORAL
  Filled 2017-12-02 (×3): qty 1

## 2017-12-02 NOTE — Progress Notes (Signed)
ANTICOAGULATION CONSULT NOTE - follow-up  Pharmacy Consult for Xarelto Indication: pulmonary embolus  Allergies  Allergen Reactions  . Oxycodone Itching  . Morphine And Related Itching    Patient Measurements: Height: 5\' 8"  (172.7 cm) Weight: 154 lb (69.9 kg) IBW/kg (Calculated) : 68.4   Vital Signs: Temp: 98.5 F (36.9 C) (09/21 2343) Temp Source: Oral (09/21 2343) BP: 129/89 (09/21 2343) Pulse Rate: 87 (09/21 2343)  Labs: Recent Labs    11/29/17 1632 11/30/17 0219  11/30/17 1939 12/01/17 0307 12/02/17 0236  HGB 15.9 14.1  --   --  15.7 15.5  HCT 47.9 42.9  --   --  48.1 47.2  PLT 332 303  --   --  305 329  HEPARINUNFRC  --  0.40   < > 0.55 0.46 >2.20*  CREATININE 1.18 1.02  --   --  0.96  --    < > = values in this interval not displayed.    Estimated Creatinine Clearance: 88.1 mL/min (by C-G formula based on SCr of 0.96 mg/dL).   Assessment: 52 yo M with history of multiple PE admitted for chest pain and SOB, not on anticoagulation PTA. Louanna Raw started for new PE as of 9/20.   9/22:   CBC stable w/ Hgb 15.5, Hct 47.2, platelets 329  No signs or symptoms of bleeding noted.   Goal of Therapy:  Heparin level 0.3-0.7 units/ml Monitor platelets by anticoagulation protocol: Yes   Plan:  Continue Xarleto 15mg  twice daily PO with meals for 21 days, followed by Xarelto 20mg  once daily with meal Continue to monitor H&H and platelets, and for signs and symptoms of bleeding.    Thank you for allowing pharmacy to be a part of this patient's care.  Tamela Gammon, PharmD 12/02/2017 7:17 AM PGY-1 Pharmacy Resident Direct Phone: [phone] Please check [email or web address] for unit-specific pharmacist phone numbers

## 2017-12-02 NOTE — Discharge Instructions (Signed)
Information on my medicine - XARELTO (rivaroxaban)  This medication education was reviewed with me or my healthcare representative as part of my discharge preparation.  WHY WAS XARELTO PRESCRIBED FOR YOU? Xarelto was prescribed to treat blood clots that may have been found in the veins of your legs (deep vein thrombosis) or in your lungs (pulmonary embolism) and to reduce the risk of them occurring again.  What do you need to know about Xarelto? The starting dose is one 15 mg tablet taken TWICE daily with food for the FIRST 21 DAYS then on 12/22/17 the dose is changed to one 20 mg tablet taken ONCE A DAY with your evening meal.  DO NOT stop taking Xarelto without talking to the health care provider who prescribed the medication.  Refill your prescription for 20 mg tablets before you run out.  After discharge, you should have regular check-up appointments with your healthcare provider that is prescribing your Xarelto.  In the future your dose may need to be changed if your kidney function changes by a significant amount.  What do you do if you miss a dose? If you are taking Xarelto TWICE DAILY and you miss a dose, take it as soon as you remember. You may take two 15 mg tablets (total 30 mg) at the same time then resume your regularly scheduled 15 mg twice daily the next day.  If you are taking Xarelto ONCE DAILY and you miss a dose, take it as soon as you remember on the same day then continue your regularly scheduled once daily regimen the next day. Do not take two doses of Xarelto at the same time.   Important Safety Information Xarelto is a blood thinner medicine that can cause bleeding. You should call your healthcare provider right away if you experience any of the following: ? Bleeding from an injury or your nose that does not stop. ? Unusual colored urine (red or dark brown) or unusual colored stools (red or black). ? Unusual bruising for unknown reasons. ? A serious fall or  if you hit your head (even if there is no bleeding).  Some medicines may interact with Xarelto and might increase your risk of bleeding while on Xarelto. To help avoid this, consult your healthcare provider or pharmacist prior to using any new prescription or non-prescription medications, including herbals, vitamins, non-steroidal anti-inflammatory drugs (NSAIDs) and supplements.  This website has more information on Xarelto: [email or web address]

## 2017-12-02 NOTE — Progress Notes (Signed)
PROGRESS NOTE    Mike Castillo  ASN:[id number] DOB: 06-10-1965 DOA: 11/29/2017 PCP: Patient, No Pcp Per  Brief Narrative:Mike Castillo is a 52 y.o. male with medical history significant for multiple PEs, patient presented to the ED with complaints of shortness of breath and chest pain. Chest pain is worse with breathing.  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  His son moves Cars from state to state.  He drove to [place] and back.  Patient has a history of PEs.  PE 2003, ~ 1 week after neck(cervical) surgery.  Another PE 2011- ED Course: Tachycardic to 126.  Intermittent tachypnea to 22, systolic 193X, O2 sats greater than 97% on room air.  Unremarkable CBC BMP.  I-STAT troponin negative.  EKG sinus tachycardia.  Unremarkable 2 view chest x-ray.  CTA chest-diffuse bilateral segmental and subsegmental pulmonary embolus identified in all lobes of both lungs.  Patient was started on heparin drip hospitalist was called to admit for pulmonary embolism.   Assessment & Plan:  Acute diffuse bilateral pulmonary emboli  -suspected to be provoked, following long distance travel in a truck for the last 3 days - clinically improving - off IV heparin, transitioned to oral Xarelto -will need anticoagulation for at least 3-6 months, according to patient previous episodes of VT E were provoked as well -monitor for another 24 hours inpatient due to symptom burden  History of recent scant BRBPR -for over one month ago -Patient reports having a colonoscopy a year back, multiple polyps were removed -no indication for GI evaluation at this time unless active bleeding ensues  Polysubstance abuse-ETOH, tobacco, cocaine.  -Thiamine folate multivitamins -Emphasized abstinence -Nicotine patch  TIAs-reports 2 episodes of TIAs within the past year -stopped. Aspirin since he'll be on anticoagulation  DVT prophylaxis: Heparin Code  Status: Full Family Communication:  Disposition Plan: home tomorrow if stable  Consultants:   none   Procedures:   Antimicrobials:    Subjective:  -Still short of breath with minimal activity, continues to have moderate pleuritic chest pain -Overall improving Objective: Vitals:   12/01/17 0804 12/01/17 1200 12/01/17 1552 12/01/17 2343  BP: 95/68 124/81 120/87 129/89  Pulse: 81  81 87  Resp: 20  20 20   Temp: 97.9 F (36.6 C) 97.7 F (36.5 C) 98.6 F (37 C) 98.5 F (36.9 C)  TempSrc: Oral Oral  Oral  SpO2: 98% 93% 99% 100%  Weight:      Height:       No intake or output data in the 24 hours ending 12/02/17 1049 Filed Weights   11/29/17 1849  Weight: 69.9 kg    Examination: Gen: Awake, Alert, Oriented X 3,  HEENT: PERRLA, Neck supple, no JVD Lungs: Good air movement bilaterally, CTAB CVS: RRR,No Gallops,Rubs or new Murmurs Abd: soft, Non tender, non distended, BS present Extremities: No Cyanosis, Clubbing or edema Skin: no new rashes      Data Reviewed:   CBC: Recent Labs  Lab 11/29/17 1632 11/30/17 0219 12/01/17 0307 12/02/17 0236  WBC 6.7 8.0 6.4 5.0  HGB 15.9 14.1 15.7 15.5  HCT 47.9 42.9 48.1 47.2  MCV 100.0 100.2* 101.3* 100.2*  PLT 332 303 305 902   Basic Metabolic Panel: Recent Labs  Lab 11/29/17 1632 11/30/17 0219 12/01/17 0307  NA 141 139 138  K 3.8 3.9 4.1  CL 105 104 100  CO2 25 25 29   GLUCOSE 132* 97 81  BUN 15 18 11   CREATININE 1.18 1.02 0.96  CALCIUM 9.2 8.9 9.5   GFR: Estimated Creatinine Clearance: 88.1 mL/min (by C-G formula based on SCr of 0.96 mg/dL). Liver Function Tests: No results for input(s): AST, ALT, ALKPHOS, BILITOT, PROT, ALBUMIN in the last 168 hours. No results for input(s): LIPASE, AMYLASE in the last 168 hours. No results for input(s): AMMONIA in the last 168 hours. Coagulation Profile: No results for input(s): INR, PROTIME in the last 168 hours. Cardiac Enzymes: No results for input(s): CKTOTAL,  CKMB, CKMBINDEX, TROPONINI in the last 168 hours. BNP (last 3 results) No results for input(s): PROBNP in the last 8760 hours. HbA1C: No results for input(s): HGBA1C in the last 72 hours. CBG: No results for input(s): GLUCAP in the last 168 hours. Lipid Profile: No results for input(s): CHOL, HDL, LDLCALC, TRIG, CHOLHDL, LDLDIRECT in the last 72 hours. Thyroid Function Tests: No results for input(s): TSH, T4TOTAL, FREET4, T3FREE, THYROIDAB in the last 72 hours. Anemia Panel: No results for input(s): VITAMINB12, FOLATE, FERRITIN, TIBC, IRON, RETICCTPCT in the last 72 hours. Urine analysis: No results found for: COLORURINE, APPEARANCEUR, LABSPEC, PHURINE, GLUCOSEU, HGBUR, BILIRUBINUR, KETONESUR, PROTEINUR, UROBILINOGEN, NITRITE, LEUKOCYTESUR Sepsis Labs: @LABRCNTIP (procalcitonin:4,lacticidven:4)  )No results found for this or any previous visit (from the past 240 hour(s)).       Radiology Studies: No results found.      Scheduled Meds: . folic acid  1 mg Oral Daily  . multivitamin with minerals  1 tablet Oral Daily  . nicotine  7 mg Transdermal QHS  . pneumococcal 23 valent vaccine  0.5 mL Intramuscular Tomorrow-1000  . rivaroxaban  15 mg Oral BID WC   And  . [START ON 12/22/2017] rivaroxaban  20 mg Oral Daily  . thiamine  100 mg Oral Daily  . traMADol  50 mg Oral Q6H   Continuous Infusions:    LOS: 1 day    Time spent: 62min    Domenic Polite, MD [facility] Page via [email or web address], password [email or web address] After 7PM please contact night-coverage  12/02/2017, 10:49 AM

## 2017-12-02 NOTE — Plan of Care (Signed)
  Problem: Activity: Goal: Risk for activity intolerance will decrease Outcome: Progressing   Problem: Clinical Measurements: Goal: Respiratory complications will improve Outcome: Progressing   

## 2017-12-03 MED ORDER — NICOTINE 7 MG/24HR TD PT24: 7.0000 mg | MEDICATED_PATCH | Freq: Every day | TRANSDERMAL | 0 refills | Status: AC

## 2017-12-03 MED ORDER — RIVAROXABAN (XARELTO) VTE STARTER PACK (15 & 20 MG): ORAL_TABLET | ORAL | 0 refills | Status: AC

## 2017-12-03 MED FILL — XARELTO STARTER PACK: 15 & 20 | 30 days supply | Qty: 51 | Fill #0

## 2017-12-12 NOTE — Discharge Summary (Signed)
Physician Discharge Summary  Mike Castillo PPJ:[id number] DOB: 01-30-1966 DOA: 11/29/2017  PCP: Patient, No Pcp Per  Admit date: 11/29/2017 Discharge date: 12/03/2017  Time spent: 35 minutes  Recommendations for Outpatient Follow-up:  1. Transitional care clinic for FU in 1 week   Discharge Diagnoses:  Active Problems:   Pulmonary embolism ([place])   TIA   Tobacco abuse  Discharge Condition: stable  Diet recommendation: heart healthy  Filed Weights   11/29/17 1849  Weight: 69.9 kg    History of present illness:  Mike Castillo a 52 y.o.malewith medical history significant for multiple PEs, patient presented to the ED with complaints of shortness of breath and chest pain.Chest pain is worse with breathing. 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 His son moves Cars from state to state. He drove to [place] and back  Hospital Course:   Acute diffuse bilateral pulmonary emboli  -suspected to be provoked, following long distance travel in a truck for the last 3 day - clinically improved - due to heavy clot burden treated with IV heparin initially, transitioned to oral Xarelto -will need anticoagulation for at least 3-6 months, according to patient previous episodes of VTE were provoked as well -given 30day xarelto free card, [facility]   History of recent scant BRBPR -for over one month ago -Patient reports having a colonoscopy a year back, multiple polyps were removed -no bleeding noted on anticoagulation  Polysubstance abuse-ETOH, tobacco, cocaine.  -treated with Thiamine folate multivitamins  -Emphasized abstinence -Nicotine patch prescribed  TIAs-reports 2 episodes of TIAs within the past year -stopped. Aspirin since he'll be on anticoagulation   Discharge Exam: Vitals:   12/02/17 2250 12/03/17 0759  BP: 112/89 103/81  Pulse: 90 88  Resp: 20   Temp: 98  F (36.7 C) 97.7 F (36.5 C)  SpO2: 100% 99%    General: AAOx3 Cardiovascular: S1S2/RRR Respiratory: CTAB  Discharge Instructions   Discharge Instructions    Diet - low sodium heart healthy   Complete by:  As directed    Increase activity slowly   Complete by:  As directed      Allergies as of 12/03/2017      Reactions   Oxycodone Itching   Morphine And Related Itching      Medication List    STOP taking these medications   aspirin EC 81 MG tablet   diclofenac sodium 1 % Gel Commonly known as:  VOLTAREN   gabapentin 600 MG tablet Commonly known as:  NEURONTIN   meloxicam 15 MG tablet Commonly known as:  MOBIC   tiZANidine 4 MG tablet Commonly known as:  ZANAFLEX     TAKE these medications   albuterol 108 (90 Base) MCG/ACT inhaler Commonly known as:  PROVENTIL HFA;VENTOLIN HFA Inhale 1 puff into the lungs every 4 (four) hours as needed for shortness of breath.   atorvastatin 40 MG tablet Commonly known as:  LIPITOR Take 40 mg by mouth at bedtime.   lamoTRIgine 100 MG tablet Commonly known as:  LAMICTAL Take 100 mg by mouth daily.   nicotine 7 mg/24hr patch Commonly known as:  NICODERM CQ - dosed in mg/24 hr Place 1 patch (7 mg total) onto the skin at bedtime.   Rivaroxaban 15 & 20 MG Tbpk Take as directed on package: Start with one 15mg  tablet by mouth twice a day with food. On Day 22, switch  to one 20mg  tablet once a day with food.   THERA Tabs Take 1 tablet by mouth daily.      Allergies  Allergen Reactions  . Oxycodone Itching  . Morphine And Related Itching   Follow-up Information    PCP. Schedule an appointment as soon as possible for a visit in 1 week(s).            The results of significant diagnostics from this hospitalization (including imaging, microbiology, ancillary and laboratory) are listed below for reference.    Significant Diagnostic Studies: Dg Chest 2 View  Result Date: 11/29/2017 CLINICAL DATA:  Chest pain and  shortness of breath today, history of multiple pulmonary emboli EXAM: CHEST - 2 VIEW COMPARISON:  None. FINDINGS: No active infiltrate or effusion is seen. Mediastinal and hilar contours are unremarkable. The heart is within normal limits in size. No bony abnormality is seen. IMPRESSION: No active cardiopulmonary disease. Electronically Signed   By: Ivar Drape M.D.   On: 11/29/2017 17:11   Ct Angio Chest Pe W And/or Wo Contrast  Result Date: 11/29/2017 CLINICAL DATA:  Chest pain and shortness of breath. Reported history of multiple PEs. EXAM: CT ANGIOGRAPHY CHEST WITH CONTRAST TECHNIQUE: Multidetector CT imaging of the chest was performed using the standard protocol during bolus administration of intravenous contrast. Multiplanar CT image reconstructions and MIPs were obtained to evaluate the vascular anatomy. CONTRAST:  175mL ISOVUE-370 IOPAMIDOL (ISOVUE-370) INJECTION 76% COMPARISON:  None. FINDINGS: Cardiovascular: Segmental and subsegmental pulmonary embolus is identified in all lobes of both lungs. There is some lobar pulmonary artery involvement in the right middle lobe. No CT evidence for right heart strain. No thoracic aortic aneurysm. Mediastinum/Nodes: No mediastinal lymphadenopathy. There is no hilar lymphadenopathy. The esophagus has normal imaging features. There is no axillary lymphadenopathy. Lungs/Pleura: The central tracheobronchial airways are patent. Peripheral subpleural areas of ground-glass attenuation are identified in the right upper lobe and left upper lobe. Compressive atelectasis noted in the dependent lower lungs bilaterally. Subpleural in centrilobular emphysema noted no pulmonary edema or pleural effusion. Upper Abdomen: 4 mm nonobstructing stone identified upper pole left kidney. Other bilateral renal calculi are evident. Musculoskeletal: No worrisome lytic or sclerotic osseous abnormality. Review of the MIP images confirms the above findings. IMPRESSION: 1. Diffuse bilateral  segmental and subsegmental pulmonary embolus identified in all lobes of both lungs. No CT evidence for right heart strain. 2. Several peripheral areas of subpleural ground-glass attenuation in the right upper lobe may reflect areas of pulmonary infarction. Consider follow-up CT chest without contrast in 3 months to re-evaluate. 3. Bilateral nephrolithiasis. 4. Critical Value/emergent results were called by telephone at the time of interpretation on 11/29/2017 at 6:37 pm to Mr. Athena Masse, [place], who verbally acknowledged these results. Electronically Signed   By: Misty Stanley M.D.   On: 11/29/2017 18:37    Microbiology: No results found for this or any previous visit (from the past 240 hour(s)).   Labs: Basic Metabolic Panel: No results for input(s): NA, K, CL, CO2, GLUCOSE, BUN, CREATININE, CALCIUM, MG, PHOS in the last 168 hours. Liver Function Tests: No results for input(s): AST, ALT, ALKPHOS, BILITOT, PROT, ALBUMIN in the last 168 hours. No results for input(s): LIPASE, AMYLASE in the last 168 hours. No results for input(s): AMMONIA in the last 168 hours. CBC: No results for input(s): WBC, NEUTROABS, HGB, HCT, MCV, PLT in the last 168 hours. Cardiac Enzymes: No results for input(s): CKTOTAL, CKMB, CKMBINDEX, TROPONINI in the last 168 hours. BNP: BNP (last  3 results) No results for input(s): BNP in the last 8760 hours.  ProBNP (last 3 results) No results for input(s): PROBNP in the last 8760 hours.  CBG: No results for input(s): GLUCAP in the last 168 hours.     Signed:  Domenic Polite MD.  [facility] 12/12/2017, 6:31 PM

## 2019-08-03 IMAGING — CT CT ANGIO CHEST
3 of 7 series · 18 of 36 positions shown · IV contrast (iopamidol)
Comparison: None.

CLINICAL DATA: Chest pain and shortness of breath. Reported history
of multiple PEs.

EXAM:
CT ANGIOGRAPHY CHEST WITH CONTRAST
TECHNIQUE: Multidetector CT imaging of the chest was performed using the
standard protocol during bolus administration of intravenous
contrast. Multiplanar CT image reconstructions and MIPs were
obtained to evaluate the vascular anatomy.
CONTRAST:  100mL 3Q6PLK-KBG IOPAMIDOL (3Q6PLK-KBG) INJECTION 76%

[Series 6: lung · axial · 0.73mm/px · z∈[+1130,+1254]mm · 3 of 156 slices shown]
[im 32/156  mediastinal]
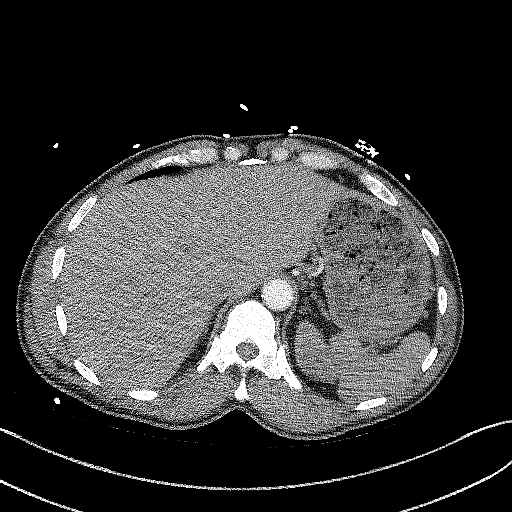
[im 63/156  mediastinal]
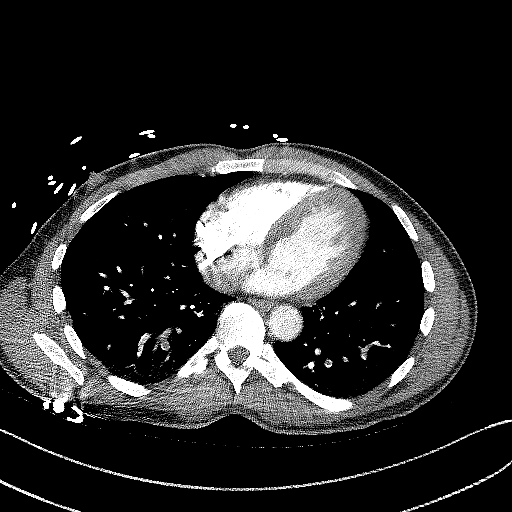
[im 94/156  mediastinal]
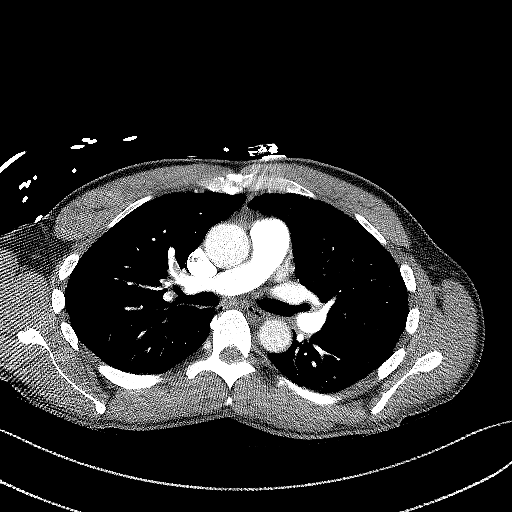

[Series 7: thins · axial · 0.73mm/px · z∈[+1088,+1357]mm · 14 of 444 slices shown]
[im 30/444  lung]
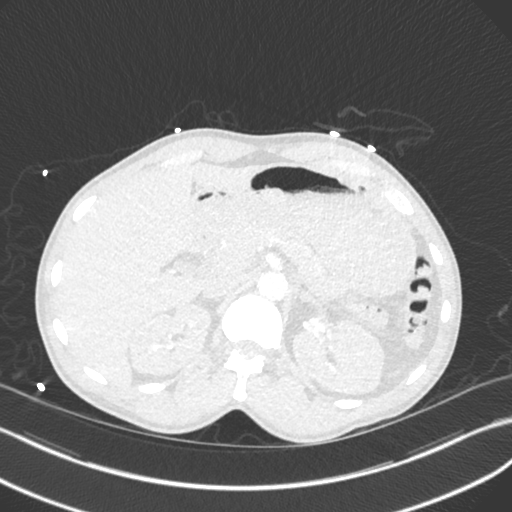
[im 60/444  mediastinal]
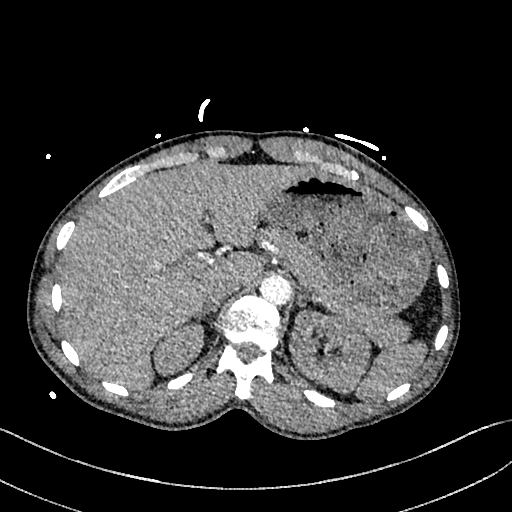
[im 89/444  lung]
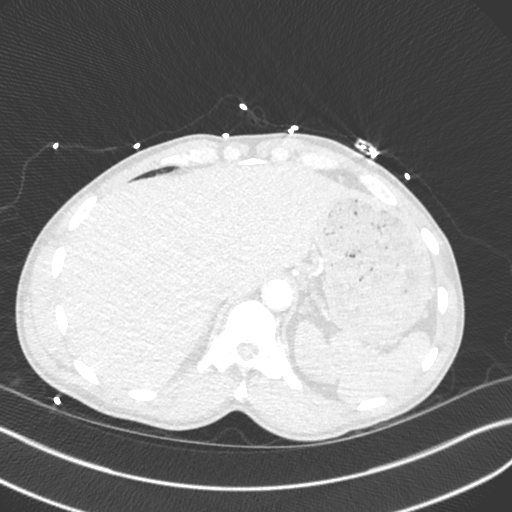
[im 119/444  mediastinal]
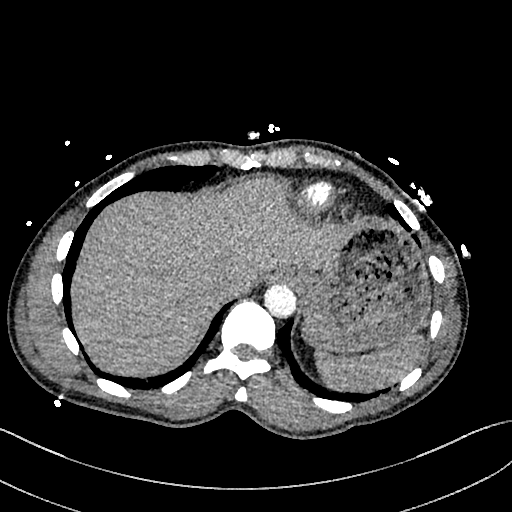
[im 148/444  lung]
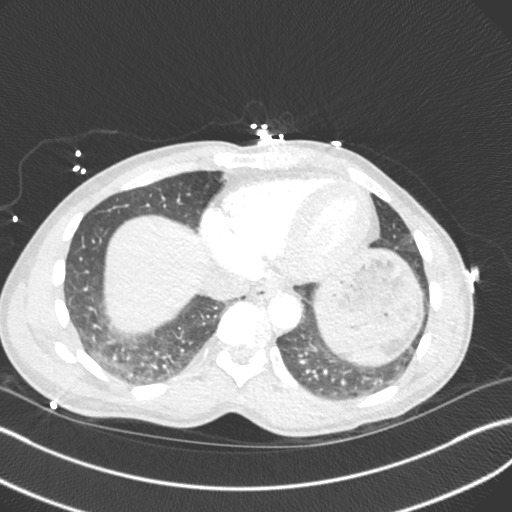
[im 178/444  mediastinal]
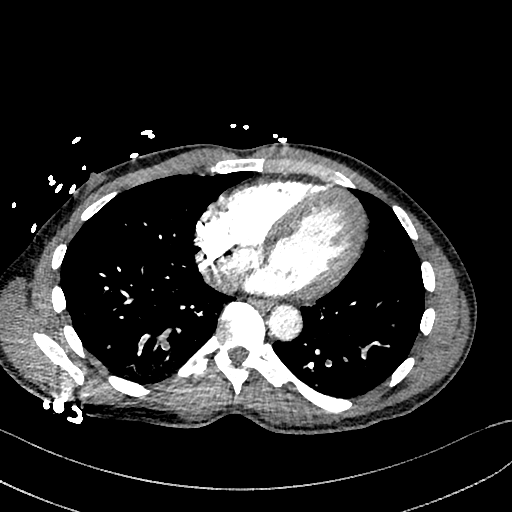
[im 207/444  lung]
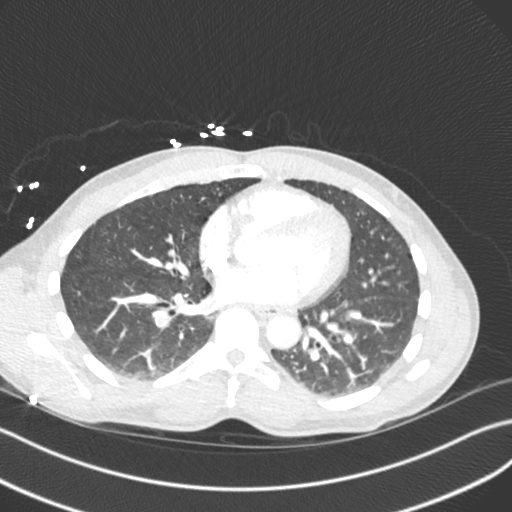
[im 237/444  mediastinal]
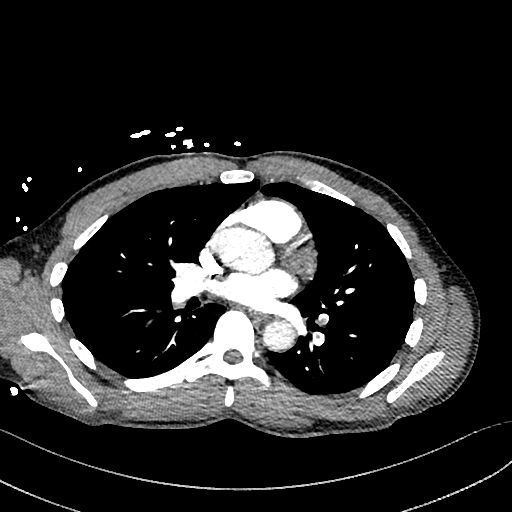
[im 266/444  lung]
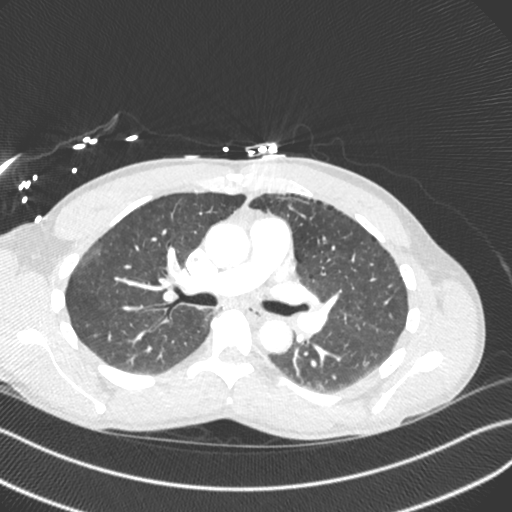
[im 296/444  mediastinal]
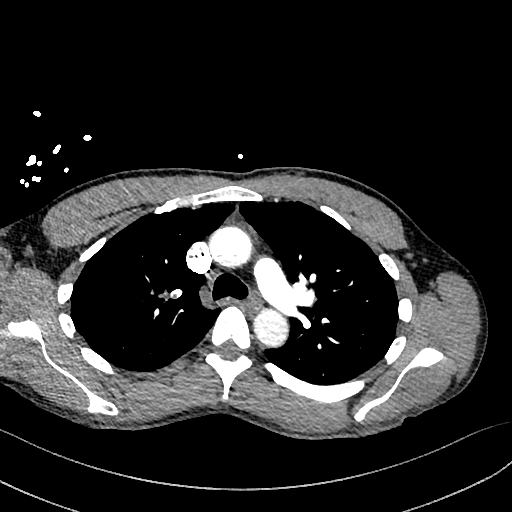
[im 325/444  lung]
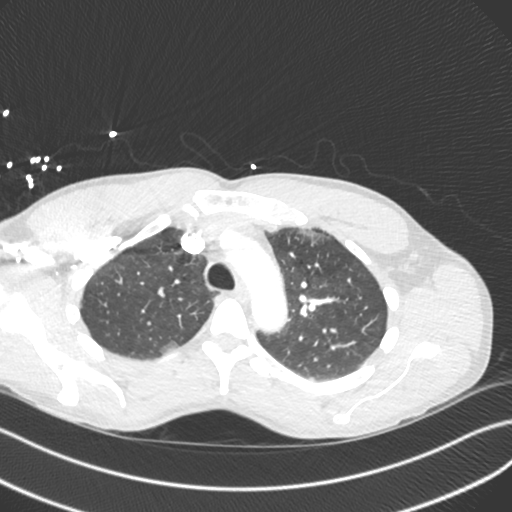
[im 355/444  mediastinal]
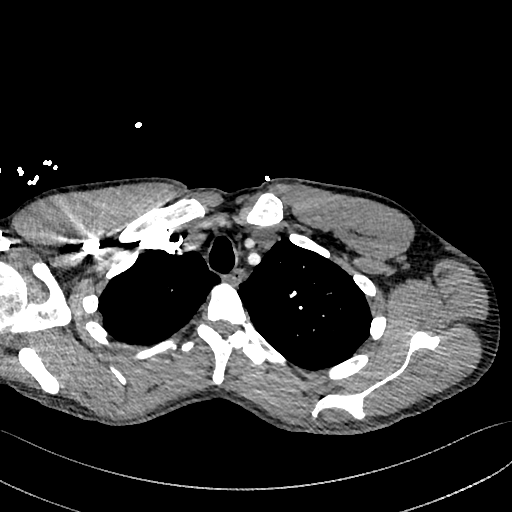
[im 384/444  lung]
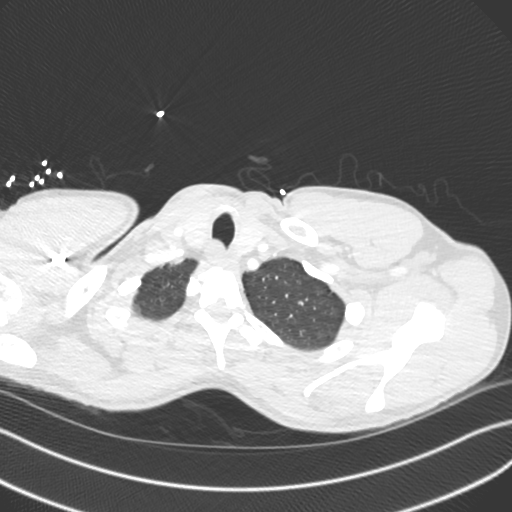
[im 414/444  mediastinal]
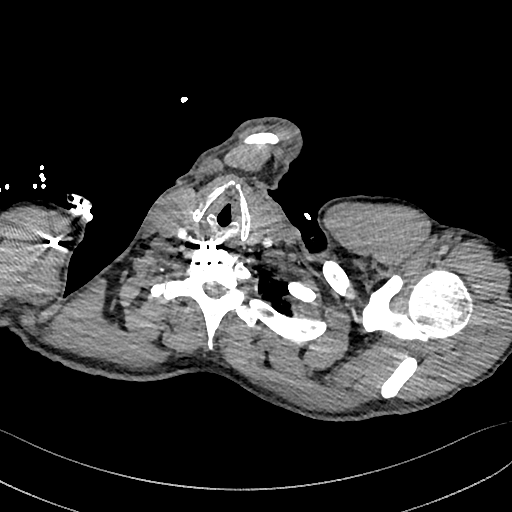

[Series 8: cor · coronal · 0.61mm/px · 1 of 151 slices shown]
[im 76/151  mediastinal]
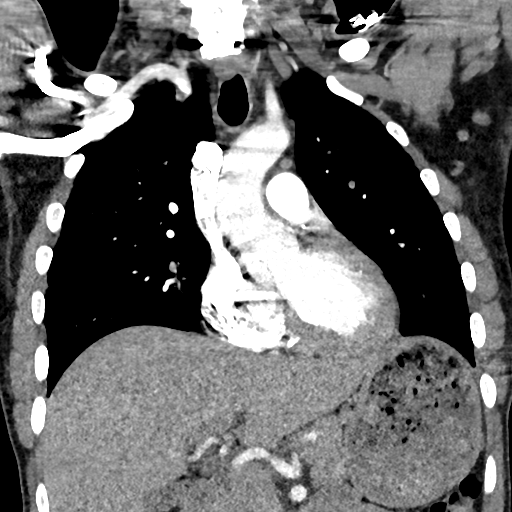

[18 of 36 positions shown; findings below may reference images not displayed]

FINDINGS: Cardiovascular: Segmental and subsegmental pulmonary embolus is
identified in all lobes of both lungs. There is some lobar pulmonary
artery involvement in the right middle lobe. No CT evidence for
right heart strain. No thoracic aortic aneurysm.

Mediastinum/Nodes: No mediastinal lymphadenopathy. There is no hilar
lymphadenopathy. The esophagus has normal imaging features. There is
no axillary lymphadenopathy.

Lungs/Pleura: The central tracheobronchial airways are patent.
Peripheral subpleural areas of ground-glass attenuation are
identified in the right upper lobe and left upper lobe. Compressive
atelectasis noted in the dependent lower lungs bilaterally.
Subpleural in centrilobular emphysema noted no pulmonary edema or
pleural effusion.

Upper Abdomen: 4 mm nonobstructing stone identified upper pole left
kidney. Other bilateral renal calculi are evident.

Musculoskeletal: No worrisome lytic or sclerotic osseous
abnormality.

Review of the MIP images confirms the above findings.
IMPRESSION: 1. Diffuse bilateral segmental and subsegmental pulmonary embolus
identified in all lobes of both lungs. No CT evidence for right
heart strain.
2. Several peripheral areas of subpleural ground-glass attenuation
in the right upper lobe may reflect areas of pulmonary infarction.
Consider follow-up CT chest without contrast in 3 months to
re-evaluate.
3. Bilateral nephrolithiasis.
4. Critical Value/emergent results were called by telephone at the
time of interpretation on 11/29/2017 at [DATE] to Mr. [place], [place],
who verbally acknowledged these results.

## 2019-08-03 IMAGING — DX DG CHEST 2V
2 series · 2 of 2 positions shown · non-contrast
Comparison: None.

CLINICAL DATA: Chest pain and shortness of breath today, history of
multiple pulmonary emboli

EXAM:
CHEST - 2 VIEW

[chest lat]
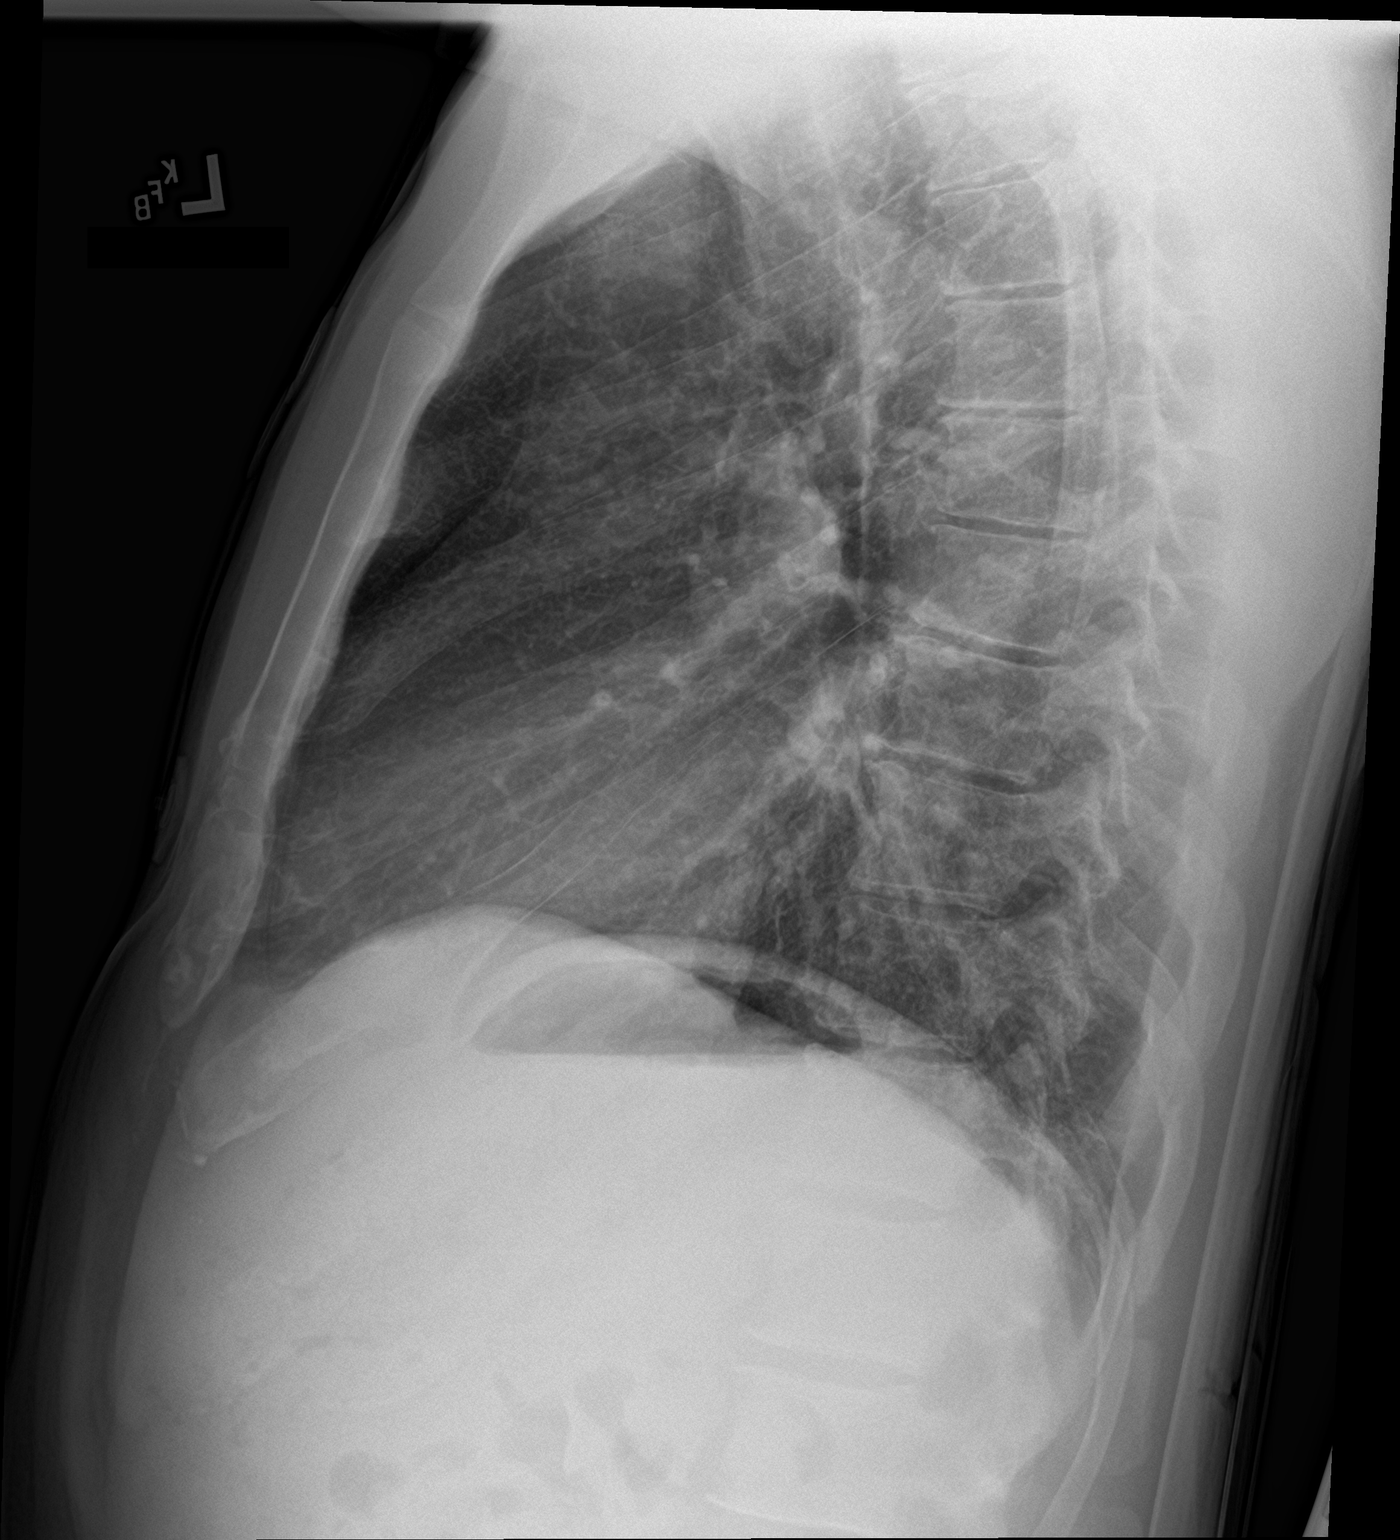

[chest ap]
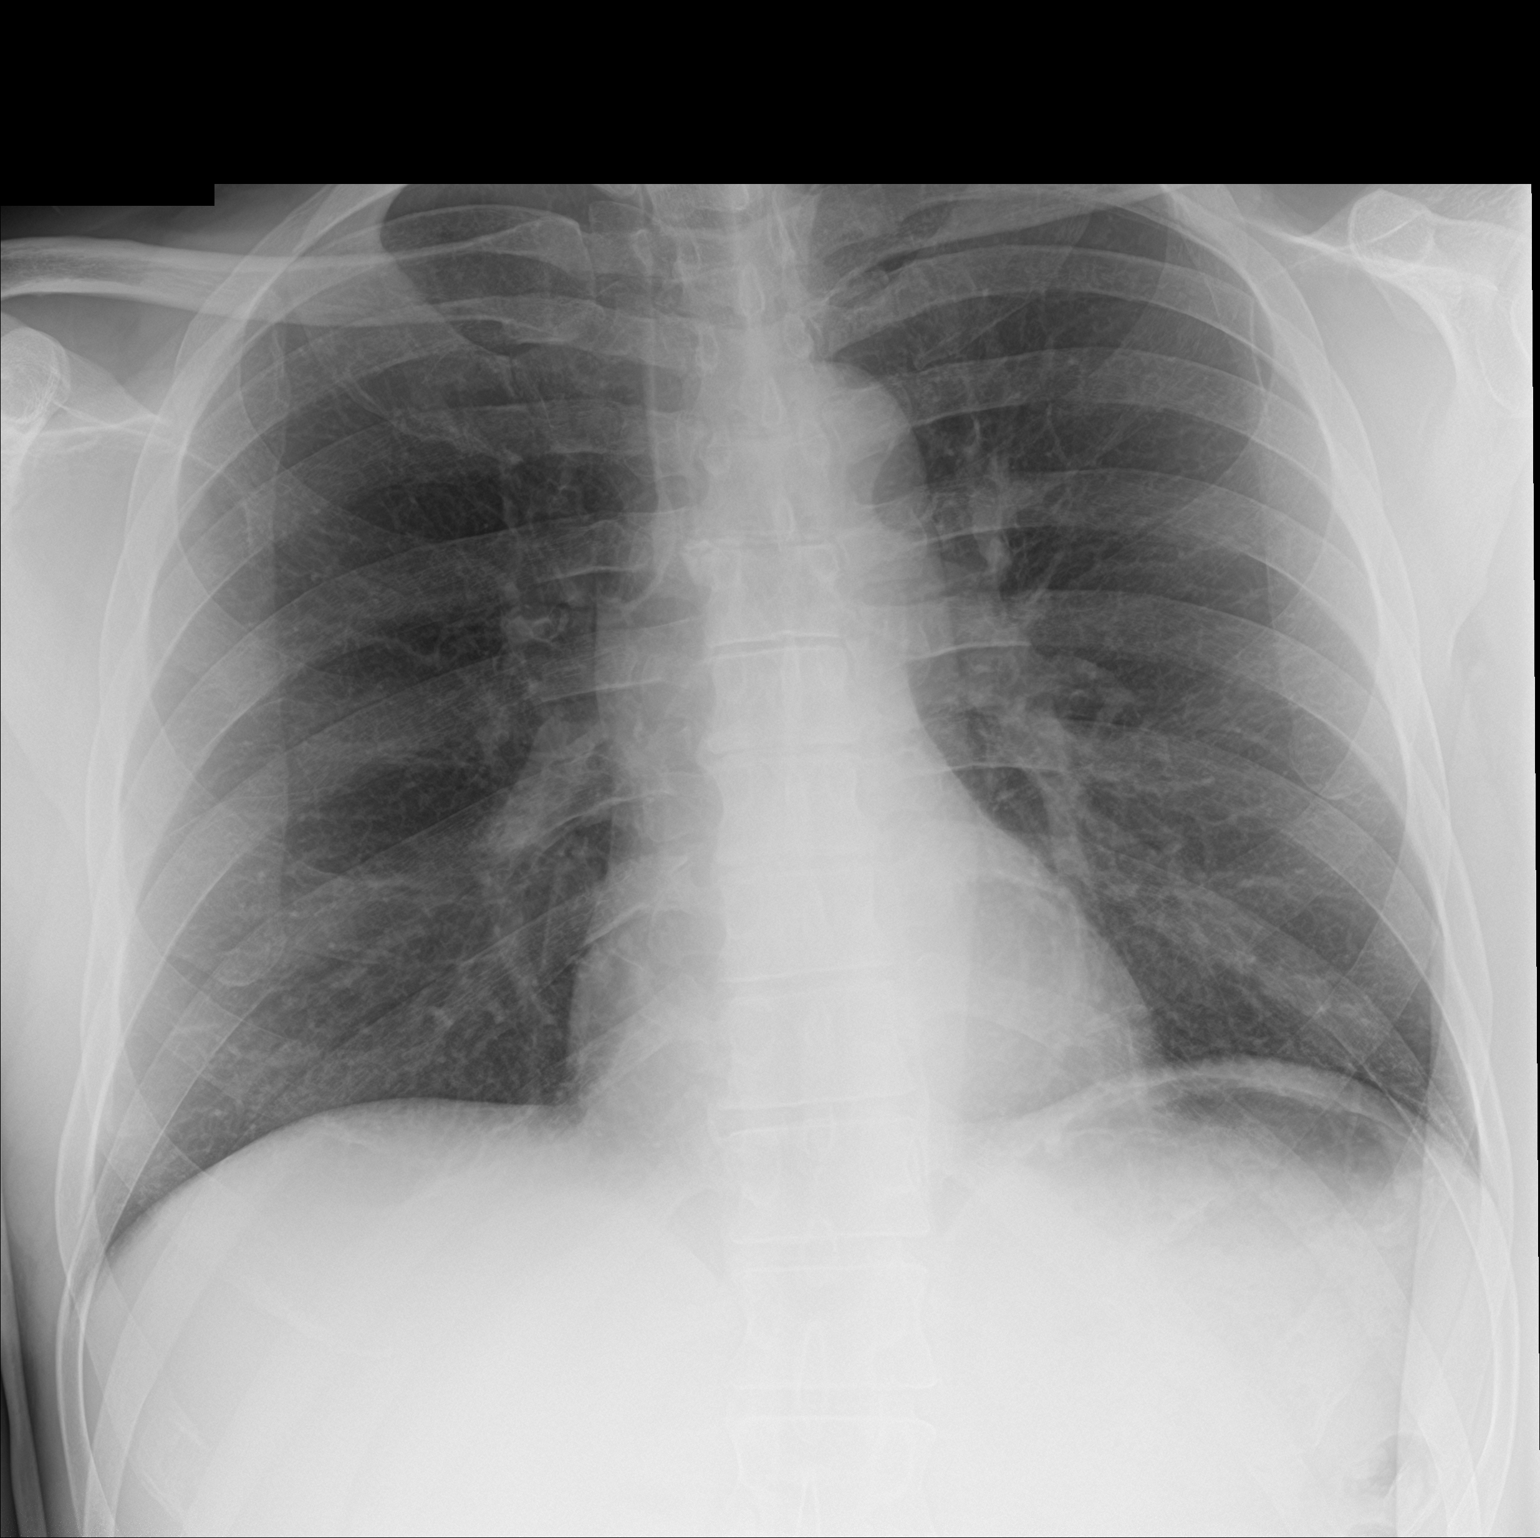

[2 of 2 positions shown; findings below may reference images not displayed]

FINDINGS: No active infiltrate or effusion is seen. Mediastinal and hilar
contours are unremarkable. The heart is within normal limits in
size. No bony abnormality is seen.
IMPRESSION: No active cardiopulmonary disease.

## 2019-08-12 ENCOUNTER — Encounter (HOSPITAL_COMMUNITY): Payer: Self-pay | Admitting: *Deleted

## 2019-08-12 ENCOUNTER — Emergency Department (HOSPITAL_COMMUNITY): Payer: Medicaid - Out of State

## 2019-08-12 ENCOUNTER — Emergency Department (HOSPITAL_COMMUNITY)
Admission: EM | Admit: 2019-08-12 | Discharge: 2019-08-13 | Disposition: A | Discharge status: Home / Self Care | Payer: Medicaid - Out of State | Attending: Emergency Medicine | Admitting: Emergency Medicine

## 2019-08-12 ENCOUNTER — Other Ambulatory Visit: Payer: Self-pay

## 2019-08-12 DIAGNOSIS — S0990XA Unspecified injury of head, initial encounter: Secondary | ICD-10-CM | POA: Diagnosis present

## 2019-08-12 DIAGNOSIS — Y999 Unspecified external cause status: Secondary | ICD-10-CM | POA: Diagnosis not present

## 2019-08-12 DIAGNOSIS — S161XXA Strain of muscle, fascia and tendon at neck level, initial encounter: Secondary | ICD-10-CM

## 2019-08-12 DIAGNOSIS — Y9389 Activity, other specified: Secondary | ICD-10-CM | POA: Insufficient documentation

## 2019-08-12 DIAGNOSIS — W1789XA Other fall from one level to another, initial encounter: Secondary | ICD-10-CM | POA: Insufficient documentation

## 2019-08-12 DIAGNOSIS — Y92818 Other transport vehicle as the place of occurrence of the external cause: Secondary | ICD-10-CM | POA: Diagnosis not present

## 2019-08-12 DIAGNOSIS — F17210 Nicotine dependence, cigarettes, uncomplicated: Secondary | ICD-10-CM | POA: Insufficient documentation

## 2019-08-12 DIAGNOSIS — Z8673 Personal history of transient ischemic attack (TIA), and cerebral infarction without residual deficits: Secondary | ICD-10-CM | POA: Diagnosis not present

## 2019-08-12 DIAGNOSIS — S0003XA Contusion of scalp, initial encounter: Secondary | ICD-10-CM | POA: Insufficient documentation

## 2019-08-12 DIAGNOSIS — S39012A Strain of muscle, fascia and tendon of lower back, initial encounter: Secondary | ICD-10-CM | POA: Insufficient documentation

## 2019-08-12 DIAGNOSIS — Z79899 Other long term (current) drug therapy: Secondary | ICD-10-CM | POA: Diagnosis not present

## 2019-08-12 DIAGNOSIS — Y929 Unspecified place or not applicable: Secondary | ICD-10-CM | POA: Insufficient documentation

## 2019-08-12 DIAGNOSIS — W19XXXA Unspecified fall, initial encounter: Secondary | ICD-10-CM

## 2019-08-12 MED ORDER — HYDROMORPHONE HCL 1 MG/ML IJ SOLN
1.0000 mg | Freq: Once | INTRAMUSCULAR | Status: AC
  Administered 2019-08-12: 1 mg via INTRAVENOUS
  Filled 2019-08-12: qty 1

## 2019-08-12 NOTE — ED Notes (Signed)
Pt taken to CT.

## 2019-08-12 NOTE — ED Provider Notes (Signed)
[place] EMERGENCY DEPARTMENT Provider Note   CSN: [id number] Arrival date & time: 08/12/19  2210     History Chief Complaint  Patient presents with  . Fall    Mike Castillo is a 54 y.o. male.  The history is provided by the patient, medical records and the EMS personnel. No language interpreter was used.  Fall   Mike Castillo is a 54 y.o. male who presents to the Emergency Department complaining of fall.  He presents to the ED for evaluation of injuries following a fall out of a tractor trailer.  He was in the trailer looking for money in his wallet and found there was none.  He got mad and left the trailer angry, causing him to fall backwards, landing four feet below on the concrete.  He complains of severe pain in his head, neck, entire back.  Pain radiates down his legs bilaterally.  No numbness, weakness.  No loss of consciousness.  He has a hx/o PE but does not take medications.  He drinks occasional alcohol - none tonight.  Did use cocaine and meth last night.      Past Medical History:  Diagnosis Date  . Anxiety   . History of kidney stones   . Pulmonary embolus ([place])   . Stroke [facility])    TIA  . TIA (transient ischemic attack)     Patient Active Problem List   Diagnosis Date Noted  . Pulmonary embolism ([place]) 11/29/2017    Past Surgical History:  Procedure Laterality Date  . NECK SURGERY         No family history on file.  Social History   Tobacco Use  . Smoking status: Current Every Day Smoker    Packs/day: 0.25  . Smokeless tobacco: Never Used  Substance Use Topics  . Alcohol use: Yes  . Drug use: Yes    Types: Marijuana, Cocaine    Home Medications Prior to Admission medications   Medication Sig Start Date End Date Taking? Authorizing Provider  albuterol (PROVENTIL HFA;VENTOLIN HFA) 108 (90 Base) MCG/ACT inhaler Inhale 1 puff into the lungs every 4 (four) hours as needed for shortness of breath.    [provider]  atorvastatin (LIPITOR) 40 MG tablet Take 40 mg by mouth at bedtime. 08/08/16   [provider]  cyclobenzaprine (FLEXERIL) 10 MG tablet Take 1 tablet (10 mg total) by mouth 2 (two) times daily as needed for muscle spasms. 08/13/19   Quintella Reichert, MD  lamoTRIgine (LAMICTAL) 100 MG tablet Take 100 mg by mouth daily.    [provider]  Multiple Vitamin (THERA) TABS Take 1 tablet by mouth daily. 08/08/16   [provider]  nicotine (NICODERM CQ - DOSED IN MG/24 HR) 7 mg/24hr patch Place 1 patch (7 mg total) onto the skin at bedtime. 12/03/17   Domenic Polite, MD  Rivaroxaban 15 & 20 MG TBPK Take as directed on package: Start with one 15mg  tablet by mouth twice a day with food. On Day 22, switch to one 20mg  tablet once a day with food. 12/03/17   Domenic Polite, MD    Allergies    Oxycodone and Morphine and related  Review of Systems   Review of Systems  All other systems reviewed and are negative.   Physical Exam Updated Vital Signs BP 113/79   Pulse (!) 103   Temp 98.2 F (36.8 C)   Resp 17   SpO2 100%   Physical Exam Vitals and nursing  note reviewed.  Constitutional:      Appearance: He is well-developed.  HENT:     Head: Normocephalic.     Comments: Abrasions occipital scalp Cardiovascular:     Rate and Rhythm: Normal rate and regular rhythm.     Heart sounds: No murmur.  Pulmonary:     Effort: Pulmonary effort is normal. No respiratory distress.     Breath sounds: Normal breath sounds.  Abdominal:     Palpations: Abdomen is soft.     Tenderness: There is no abdominal tenderness. There is no guarding or rebound.  Musculoskeletal:        General: No tenderness.     Comments: 2+ DP pulses bilaterally  Skin:    General: Skin is warm and dry.  Neurological:     Mental Status: He is alert and oriented to person, place, and time.     Comments: 5/5 strength in BUE, 4+/5 strength in BLE (pain limits strength testing).  Sensation to  light touch intact in all four extremities.   Psychiatric:        Behavior: Behavior normal.     ED Results / Procedures / Treatments   Labs (all labs ordered are listed, but only abnormal results are displayed) Labs Reviewed - No data to display  EKG None  Radiology CT Head Wo Contrast  Result Date: 08/12/2019 CLINICAL DATA:  Fall from truck with headaches and neck pain, initial encounter EXAM: CT HEAD WITHOUT CONTRAST CT CERVICAL SPINE WITHOUT CONTRAST TECHNIQUE: Multidetector CT imaging of the head and cervical spine was performed following the standard protocol without intravenous contrast. Multiplanar CT image reconstructions of the cervical spine were also generated. COMPARISON:  10/08/2017. FINDINGS: CT HEAD FINDINGS Brain: No evidence of acute infarction, hemorrhage, hydrocephalus, extra-axial collection or mass lesion/mass effect. Vascular: No hyperdense vessel or unexpected calcification. Skull: Normal. Negative for fracture or focal lesion. Sinuses/Orbits: No acute finding. Other: None. CT CERVICAL SPINE FINDINGS Alignment: Within normal limits. Skull base and vertebrae: 7 cervical segments are well visualized. Postsurgical changes are noted extending from C3 to C7. Mild osteophytic changes are seen. No acute fracture or acute facet abnormality is noted. Soft tissues and spinal canal: Surrounding soft tissue structures demonstrate mild atherosclerotic calcifications. No other soft tissue abnormality is noted. Upper chest: Visualized lung apices are within normal limits. Other: None IMPRESSION: CT of the head: No acute intracranial abnormality noted. CT of the cervical spine: Postoperative and degenerative changes without acute abnormality. Electronically Signed   By: Inez Catalina M.D.   On: 08/12/2019 23:36   CT Cervical Spine Wo Contrast  Result Date: 08/12/2019 CLINICAL DATA:  Fall from truck with headaches and neck pain, initial encounter EXAM: CT HEAD WITHOUT CONTRAST CT CERVICAL  SPINE WITHOUT CONTRAST TECHNIQUE: Multidetector CT imaging of the head and cervical spine was performed following the standard protocol without intravenous contrast. Multiplanar CT image reconstructions of the cervical spine were also generated. COMPARISON:  10/08/2017. FINDINGS: CT HEAD FINDINGS Brain: No evidence of acute infarction, hemorrhage, hydrocephalus, extra-axial collection or mass lesion/mass effect. Vascular: No hyperdense vessel or unexpected calcification. Skull: Normal. Negative for fracture or focal lesion. Sinuses/Orbits: No acute finding. Other: None. CT CERVICAL SPINE FINDINGS Alignment: Within normal limits. Skull base and vertebrae: 7 cervical segments are well visualized. Postsurgical changes are noted extending from C3 to C7. Mild osteophytic changes are seen. No acute fracture or acute facet abnormality is noted. Soft tissues and spinal canal: Surrounding soft tissue structures demonstrate mild atherosclerotic calcifications. No other  soft tissue abnormality is noted. Upper chest: Visualized lung apices are within normal limits. Other: None IMPRESSION: CT of the head: No acute intracranial abnormality noted. CT of the cervical spine: Postoperative and degenerative changes without acute abnormality. Electronically Signed   By: Inez Catalina M.D.   On: 08/12/2019 23:36   CT Thoracic Spine Wo Contrast  Result Date: 08/12/2019 CLINICAL DATA:  Fall EXAM: CT LUMBAR SPINE WITHOUT CONTRAST TECHNIQUE: Multidetector CT imaging of the lumbar spine was performed without intravenous contrast administration. Multiplanar CT image reconstructions were also generated. COMPARISON:  None. FINDINGS: Alignment: Normal Vertebrae: Vertebral body heights are well maintained. No fracture, cortical destruction, or osseous lesion. Paraspinal and other soft tissues: Normal appearance to the paraspinal soft tissues and retroperitoneum. Disc levels: No significant canal or neural foraminal narrowing. Lumbar spine:  Segmentation: There are 5 non-rib bearing lumbar type vertebral bodies with the last intervertebral disc space labeled as L5-S1. Alignment: Normal Vertebrae: The vertebral body heights are well maintained. No fracture, malalignment, or pathologic osseous lesions seen. Paraspinal and other soft tissues: The paraspinal soft tissues and visualized retroperitoneal structures are unremarkable. The sacroiliac joints are intact. Disc levels: Disc height loss with endplate reactive changes and anterior osteophytes are most notable at L4-L5 with moderate neural foraminal narrowing and mild central canal stenosis. IMPRESSION: No acute fracture or malalignment of the spine. Electronically Signed   By: Prudencio Pair M.D.   On: 08/12/2019 23:42   CT Lumbar Spine Wo Contrast  Result Date: 08/12/2019 CLINICAL DATA:  Fall EXAM: CT LUMBAR SPINE WITHOUT CONTRAST TECHNIQUE: Multidetector CT imaging of the lumbar spine was performed without intravenous contrast administration. Multiplanar CT image reconstructions were also generated. COMPARISON:  None. FINDINGS: Alignment: Normal Vertebrae: Vertebral body heights are well maintained. No fracture, cortical destruction, or osseous lesion. Paraspinal and other soft tissues: Normal appearance to the paraspinal soft tissues and retroperitoneum. Disc levels: No significant canal or neural foraminal narrowing. Lumbar spine: Segmentation: There are 5 non-rib bearing lumbar type vertebral bodies with the last intervertebral disc space labeled as L5-S1. Alignment: Normal Vertebrae: The vertebral body heights are well maintained. No fracture, malalignment, or pathologic osseous lesions seen. Paraspinal and other soft tissues: The paraspinal soft tissues and visualized retroperitoneal structures are unremarkable. The sacroiliac joints are intact. Disc levels: Disc height loss with endplate reactive changes and anterior osteophytes are most notable at L4-L5 with moderate neural foraminal  narrowing and mild central canal stenosis. IMPRESSION: No acute fracture or malalignment of the spine. Electronically Signed   By: Prudencio Pair M.D.   On: 08/12/2019 23:42   DG Chest Port 1 View  Result Date: 08/12/2019 CLINICAL DATA:  Fall from semi truck with chest pain, initial encounter EXAM: PORTABLE CHEST 1 VIEW COMPARISON:  11/29/2017 FINDINGS: The heart size and mediastinal contours are within normal limits. Both lungs are clear. The visualized skeletal structures are unremarkable. IMPRESSION: No active disease. Electronically Signed   By: Inez Catalina M.D.   On: 08/12/2019 23:52    Procedures Procedures (including critical care time)  Medications Ordered in ED Medications  HYDROmorphone (DILAUDID) injection 1 mg (1 mg Intravenous Given 08/12/19 2257)  HYDROmorphone (DILAUDID) injection 1 mg (1 mg Intravenous Given 08/13/19 0102)    ED Course  I have reviewed the triage vital signs and the nursing notes.  Pertinent labs & imaging results that were available during my care of the patient were reviewed by me and considered in my medical decision making (see chart for details).  MDM Rules/Calculators/A&P                     Patient here for evaluation of injuries following a mechanical fall off of his tractor-trailer. It was a fall from about 4 feet. He does have diffuse spine tenderness on examination. Patient did have decreased strength and bilateral lower extremities on ED presentation but significant pain that limited his strength testing. On repeat examination after pain medications his strength is improved and he has five out of five strength in all four extremities. He is able to range his neck without difficulty. Discussed with patient home care for muscle strain and scalp contusion. Given his history of substance abuse feel that narcotics are not a good prescription option. Discussed supportive care with Tylenol and ibuprofen, available over-the-counter. Will provide prescription  for muscle relaxer. Outpatient follow-up and return precautions discussed.  Final Clinical Impression(s) / ED Diagnoses Final diagnoses:  Fall, initial encounter  Contusion of scalp, initial encounter  Strain of neck muscle, initial encounter  Strain of lumbar region, initial encounter    Rx / DC Orders ED Discharge Orders         Ordered    cyclobenzaprine (FLEXERIL) 10 MG tablet  2 times daily PRN     08/13/19 0107           Quintella Reichert, MD 08/13/19 0127

## 2019-08-12 NOTE — ED Triage Notes (Signed)
Pt was stepping down from a semi truck and tripped landing supine. Pt in c-collar on arrival, c/o cervical pain (hx of cervical fusion) and low back pain. Hx of PE, has not taken blood thinners in over 2 years. Hematoma and abrasion to occipital lobe per EMS, abrasion to R elbow. Reports numbness to bilateral feet

## 2019-08-13 MED ORDER — HYDROMORPHONE HCL 1 MG/ML IJ SOLN
1.0000 mg | Freq: Once | INTRAMUSCULAR | Status: AC
  Administered 2019-08-13: 1 mg via INTRAVENOUS
  Filled 2019-08-13: qty 1

## 2019-08-13 MED ORDER — METHOCARBAMOL 1000 MG/10ML IJ SOLN: 1000.0000 mg | Freq: Once | INTRAMUSCULAR | Status: DC

## 2019-08-13 MED ORDER — CYCLOBENZAPRINE HCL 10 MG PO TABS: 10.0000 mg | ORAL_TABLET | Freq: Two times a day (BID) | ORAL | 0 refills | Status: AC | PRN

## 2019-08-13 MED ORDER — METHOCARBAMOL 1000 MG/10ML IJ SOLN
1000.0000 mg | Freq: Once | INTRAVENOUS | Status: DC
  Filled 2019-08-13: qty 10

## 2019-08-13 NOTE — ED Notes (Signed)
Family at bedside. 

## 2021-06-15 ENCOUNTER — Telehealth: Payer: Self-pay | Admitting: Radiation Oncology

## 2021-06-15 ENCOUNTER — Ambulatory Visit
Admission: RE | Admit: 2021-06-15 | Discharge: 2021-06-15 | Disposition: A | Discharge status: Home / Self Care | Payer: Self-pay | Source: Ambulatory Visit | Attending: Radiation Oncology | Admitting: Radiation Oncology

## 2021-06-15 ENCOUNTER — Other Ambulatory Visit: Payer: Self-pay | Admitting: Radiation Oncology

## 2021-06-15 DIAGNOSIS — C61 Malignant neoplasm of prostate: Secondary | ICD-10-CM

## 2021-06-15 NOTE — Telephone Encounter (Signed)
4/5 @ 11:09 am called patient's phone number could not be complete at this time.  Will try again later.   ?

## 2021-06-15 NOTE — Telephone Encounter (Signed)
4/5 @ 2:35 pm called patient phone # phone does not ring and could not be complete at this time.  Left voicemail with patient's son Mike Castillo [phone], for patient to call our office.   ?

## 2021-06-16 ENCOUNTER — Telehealth: Payer: Self-pay | Admitting: Radiation Oncology

## 2021-06-16 NOTE — Telephone Encounter (Signed)
4/6 @ 2:26 pm patient's phone # still not able to be complete at this time.  ?

## 2021-06-17 ENCOUNTER — Telehealth: Payer: Self-pay | Admitting: Radiation Oncology

## 2021-06-17 NOTE — Telephone Encounter (Signed)
4/7 @ 9:42 am still no answer patient cannot be reach.  Called patient's son # Left voicemail for patient to call our office.  Letter sent.  ?
